# Patient Record
Sex: Female | Born: 1960
Health system: Southern US, Community
[De-identification: ages and names within clinical notes are randomized; demographics above are authoritative.]

## PROBLEM LIST (undated history)

## (undated) DIAGNOSIS — Z720 Tobacco use: Secondary | ICD-10-CM

## (undated) DIAGNOSIS — R519 Headache, unspecified: Secondary | ICD-10-CM

## (undated) DIAGNOSIS — K589 Irritable bowel syndrome without diarrhea: Secondary | ICD-10-CM

## (undated) DIAGNOSIS — J329 Chronic sinusitis, unspecified: Secondary | ICD-10-CM

## (undated) DIAGNOSIS — I252 Old myocardial infarction: Secondary | ICD-10-CM

## (undated) DIAGNOSIS — G8929 Other chronic pain: Secondary | ICD-10-CM

## (undated) DIAGNOSIS — M1990 Unspecified osteoarthritis, unspecified site: Secondary | ICD-10-CM

## (undated) DIAGNOSIS — K219 Gastro-esophageal reflux disease without esophagitis: Secondary | ICD-10-CM

## (undated) DIAGNOSIS — E785 Hyperlipidemia, unspecified: Secondary | ICD-10-CM

## (undated) DIAGNOSIS — F329 Major depressive disorder, single episode, unspecified: Secondary | ICD-10-CM

## (undated) DIAGNOSIS — J449 Chronic obstructive pulmonary disease, unspecified: Secondary | ICD-10-CM

## (undated) DIAGNOSIS — R51 Headache: Secondary | ICD-10-CM

## (undated) DIAGNOSIS — I10 Essential (primary) hypertension: Secondary | ICD-10-CM

## (undated) DIAGNOSIS — I501 Left ventricular failure: Secondary | ICD-10-CM

## (undated) DIAGNOSIS — E039 Hypothyroidism, unspecified: Secondary | ICD-10-CM

## (undated) DIAGNOSIS — K52832 Lymphocytic colitis: Secondary | ICD-10-CM

## (undated) DIAGNOSIS — M797 Fibromyalgia: Secondary | ICD-10-CM

## (undated) DIAGNOSIS — F32A Depression, unspecified: Secondary | ICD-10-CM

## (undated) DIAGNOSIS — I2510 Atherosclerotic heart disease of native coronary artery without angina pectoris: Secondary | ICD-10-CM

## (undated) HISTORY — DX: Hyperlipidemia, unspecified: E78.5

## (undated) HISTORY — DX: Hypothyroidism, unspecified: E03.9

## (undated) HISTORY — PX: TOTAL ABDOMINAL HYSTERECTOMY W/ BILATERAL SALPINGOOPHORECTOMY: SHX83

## (undated) HISTORY — DX: Atherosclerotic heart disease of native coronary artery without angina pectoris: I25.10

## (undated) HISTORY — DX: Essential (primary) hypertension: I10

## (undated) HISTORY — PX: CHOLECYSTECTOMY: SHX55

## (undated) HISTORY — DX: Chronic obstructive pulmonary disease, unspecified: J44.9

## (undated) HISTORY — DX: Chronic sinusitis, unspecified: J32.9

## (undated) HISTORY — DX: Headache, unspecified: R51.9

## (undated) HISTORY — DX: Tobacco use: Z72.0

## (undated) HISTORY — DX: Left ventricular failure, unspecified: I50.1

## (undated) HISTORY — DX: Fibromyalgia: M79.7

## (undated) HISTORY — DX: Lymphocytic colitis: K52.832

## (undated) HISTORY — PX: EYE SURGERY: SHX253

## (undated) HISTORY — DX: Headache: R51

## (undated) HISTORY — DX: Old myocardial infarction: I25.2

## (undated) HISTORY — PX: BACK SURGERY: SHX140

## (undated) HISTORY — DX: Irritable bowel syndrome, unspecified: K58.9

## (undated) HISTORY — DX: Other chronic pain: G89.29

## (undated) HISTORY — PX: FOOT SURGERY: SHX648

---

## 2004-11-13 ENCOUNTER — Ambulatory Visit: Payer: Self-pay | Admitting: Family Medicine

## 2004-11-19 ENCOUNTER — Ambulatory Visit: Payer: Self-pay | Admitting: Cardiology

## 2004-11-30 ENCOUNTER — Ambulatory Visit: Payer: Self-pay

## 2004-12-10 ENCOUNTER — Ambulatory Visit: Payer: Self-pay | Admitting: Cardiology

## 2004-12-28 ENCOUNTER — Ambulatory Visit: Payer: Self-pay | Admitting: Family Medicine

## 2005-07-02 ENCOUNTER — Ambulatory Visit: Payer: Self-pay | Admitting: Family Medicine

## 2005-07-16 ENCOUNTER — Ambulatory Visit: Payer: Self-pay | Admitting: Family Medicine

## 2006-07-02 ENCOUNTER — Ambulatory Visit: Payer: Self-pay | Admitting: Family Medicine

## 2008-04-22 DIAGNOSIS — K52832 Lymphocytic colitis: Secondary | ICD-10-CM

## 2008-04-22 HISTORY — DX: Lymphocytic colitis: K52.832

## 2009-11-06 ENCOUNTER — Encounter: Payer: Self-pay | Admitting: Internal Medicine

## 2009-11-09 ENCOUNTER — Ambulatory Visit: Payer: Self-pay | Admitting: Internal Medicine

## 2009-11-09 DIAGNOSIS — R0602 Shortness of breath: Secondary | ICD-10-CM

## 2009-11-09 DIAGNOSIS — E785 Hyperlipidemia, unspecified: Secondary | ICD-10-CM

## 2009-11-09 DIAGNOSIS — I10 Essential (primary) hypertension: Secondary | ICD-10-CM | POA: Insufficient documentation

## 2009-11-09 DIAGNOSIS — R51 Headache: Secondary | ICD-10-CM

## 2009-11-09 DIAGNOSIS — J310 Chronic rhinitis: Secondary | ICD-10-CM

## 2009-11-09 DIAGNOSIS — R519 Headache, unspecified: Secondary | ICD-10-CM | POA: Insufficient documentation

## 2009-11-09 DIAGNOSIS — E039 Hypothyroidism, unspecified: Secondary | ICD-10-CM | POA: Insufficient documentation

## 2009-11-09 DIAGNOSIS — Z720 Tobacco use: Secondary | ICD-10-CM

## 2009-11-09 DIAGNOSIS — IMO0001 Reserved for inherently not codable concepts without codable children: Secondary | ICD-10-CM

## 2009-11-15 ENCOUNTER — Ambulatory Visit: Payer: Self-pay | Admitting: Cardiology

## 2009-11-16 ENCOUNTER — Telehealth (INDEPENDENT_AMBULATORY_CARE_PROVIDER_SITE_OTHER): Payer: Self-pay | Admitting: *Deleted

## 2010-01-23 ENCOUNTER — Emergency Department (HOSPITAL_COMMUNITY): Admission: EM | Admit: 2010-01-23 | Discharge: 2010-01-23 | Payer: Self-pay | Admitting: Emergency Medicine

## 2010-05-22 NOTE — Progress Notes (Signed)
Summary: results of sinus ct given>start augmentin   Phone Note Call from Patient Call back at Work Phone [phone]   Caller: Patient Call For: wert Summary of Call: returning call to leslie Initial call taken by: Lacinda Axon,  November 16, 2009 8:17 AM  Follow-up for Phone Call        Spoke with pt and notified of results/recs per MW. Pt verbalized understanding.  Rx was sent to pharm.  Pt sched for followup on 11/30/09 at 11:30 am. Follow-up by: Vernie Murders,  November 16, 2009 9:09 AM    New/Updated Medications: AUGMENTIN 875-125 MG TABS (AMOXICILLIN-POT CLAVULANATE) 1 by mouth two times a day until gone Prescriptions: AUGMENTIN 875-125 MG TABS (AMOXICILLIN-POT CLAVULANATE) 1 by mouth two times a day until gone  #20 x 0   Entered by:   Vernie Murders   Authorized by:   Nyoka Cowden MD   Signed by:   Vernie Murders on 11/16/2009   Method used:   Electronically to        [facility] [address] [facility] (retail)       [address]       [place], [place]  [address]       Ph: [phone]       Fax: [phone]   RxID:   [id number]

## 2010-05-22 NOTE — Assessment & Plan Note (Signed)
Summary: Pulmonary consultation/ cough and sob/ smoking ? sinus dz   Visit Type:  Initial Consult Copy to:  Dr. Joette Catching Primary Provider/Referring Provider:  Dr. Leticia Penna  CC:  Cough.  History of Present Illness: 50 yowf active smoker trying to quit with episodes of bronchitis maybe once a year typically in winter better after 2  weeks with abx no  need for inhalers/prednisone  November 09, 2009  1st pulmonary office eval cc cough x 1 month > grey green thick worse in am's and with activity assoc increase cough and sob.  presently finishing levaquin and prednisone and on both advair and dulera and no better. Assoc with mild nasal congestion, no sinus pain    Pt denies any significant sore throat, dysphagia, itching, sneezing,   fever, chills, sweats, unintended wt loss, pleuritic or exertional cp, hempoptysis, change in activity tolerance  orthopnea pnd or leg swelling. Pt also denies any obvious fluctuation in symptoms with weather or environmental change or other alleviating or aggravating factors.       Current Medications (verified): 1)  Entocort Ec 3 Mg Xr24h-Cap (Budesonide) .Marland Kitchen.. 1 Once Daily 2)  Cymbalta 60 Mg Cpep (Duloxetine Hcl) .Marland Kitchen.. 1 Once Daily 3)  Advair Diskus 100-50 Mcg/dose Aepb (Fluticasone-Salmeterol) .Marland Kitchen.. 1 Puff Two Times A Day 4)  Flonase 50 Mcg/act Susp (Fluticasone Propionate) .Marland Kitchen.. 1 Spray Each Nostril Two Times A Day 5)  Flexeril 10 Mg Tabs (Cyclobenzaprine Hcl) .Marland Kitchen.. 1 Three Times A Day As Needed 6)  Zocor 20 Mg Tabs (Simvastatin) .Marland Kitchen.. 1 Once Daily 7)  Prilosec Otc 20 Mg Tbec (Omeprazole Magnesium) .Marland Kitchen.. 1 Once Daily As Needed 8)  Vicodin 5-500 Mg Tabs (Hydrocodone-Acetaminophen) .Marland Kitchen.. 1 Every 6 Hours As Needed 9)  Acyclovir 400 Mg Tabs (Acyclovir) .Marland Kitchen.. 1 Once Daily 10)  Metoprolol Tartrate 25 Mg Tabs (Metoprolol Tartrate) .Marland Kitchen.. 1 Two Times A Day 11)  Wellbutrin Xl 150 Mg Xr24h-Tab (Bupropion Hcl) .Marland Kitchen.. 1 Once Daily  Allergies (verified): 1)  !  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  Smokes 1 ppd. No ETOH  Review of Systems       The patient complains of shortness of breath with activity, productive cough, chest pain, loss of appetite, abdominal pain, nasal congestion/difficulty breathing through nose, ear ache, anxiety, depression, and change in color of mucus.  The patient denies shortness of breath at rest, non-productive cough, coughing up blood, irregular heartbeats, acid heartburn, indigestion, weight change, difficulty swallowing, sore throat, tooth/dental problems, headaches, sneezing, itching, hand/feet swelling, joint stiffness or pain, rash, and fever.    Vital Signs:  Patient profile:   50 year old female Height:      62.5 inches Weight:      153.38 pounds BMI:     27.71 O2 Sat:      98 % on Room air Temp:     98.5 degrees F oral Pulse rate:   84 / minute BP sitting:   130 / 78  (left arm)  Vitals Entered By:  Vernie Murders (November 09, 2009 11:34 AM)  O2 Flow:  Room air  Physical Exam  Additional Exam:  amb wf nad HEENT mild turbinate edema.  Oropharynx no thrush or excess pnd or cobblestoning.  No JVD or cervical adenopathy. Mild accessory muscle hypertrophy. Trachea midline, nl thryroid. Chest was hyperinflated by percussion with diminished breath sounds and moderate  increased exp time without wheeze. Hoover sign positive at mid inspiration. Regular rate and rhythm without murmur gallop or rub or increase P2 or edema.  Abd: no hsm, nl excursion. Ext warm without cyanosis or clubbing.     Impression & Recommendations:  Problem # 1:  DYSPNEA (ICD-786.05)    DDX of  difficult airways managment all start with A and  include Adherence, Ace Inhibitors, Acid Reflux, Active Sinus Disease, Alpha 1 Antitripsin deficiency, Anxiety masquerading as Airways dz,  ABPA,  allergy(esp in young), Aspiration (esp in elderly), Adverse effects of DPI,  Active smokers, plus one B  = Beta blocker use.Marland Kitchen   Active smoking greatest concern see problem # 3  ? acid reflux :  max rx plus diet reviewed  ? adverse effect of DPI - try just on hfa  ? adherence: not transparent re meds. I spent extra time with the patient today explaining optimal mdi  technique.  This improved from  25-50%  Orders: Consultation Level V (44010)  Problem # 2:  CHRONIC RHINITIS (ICD-472.0) Needs sinus ct since still purulent sputum on levaquin  Problem # 3:  SMOKER (ICD-305.1)   I emphasized that although we never turn away smokers from the pulmonary clinic, we do ask that they understand that the recommendations that were made won't work nearly as well in the presence of continued cigarette exposure and we may reach a point where we can't help the patient if he/she can't quit smoking.   Try increase wellbutrin to 300 mg daily if tolerates  Each maintenance medication was reviewed in detail including most importantly the difference between maintenance prns and under what circumstances the prns are to be used.  In addition, these two groups (for which the patient should keep up with refills) were distinguished from a third group :  meds that are used only short term with the intent to complete a course of therapy and then not refill them.  The med list was then fully reconciled and reorganized to reflect  this important distinction.   Orders: Consultation Level V [phone])  Medications Added to Medication List This Visit: 1)  Entocort Ec 3 Mg Xr24h-cap (Budesonide) .Marland Kitchen.. 1 once daily 2)  Cymbalta 60 Mg Cpep (Duloxetine hcl) .Marland Kitchen.. 1 once daily 3)  Advair Diskus 100-50 Mcg/dose Aepb (Fluticasone-salmeterol) .Marland Kitchen.. 1 puff two times a day 4)  Flonase 50 Mcg/act Susp (Fluticasone propionate) .Marland Kitchen.. 1 spray each nostril two times a day 5)  Zocor 20 Mg Tabs (Simvastatin) .Marland Kitchen.. 1 once daily 6)  Prilosec Otc 20 Mg Tbec (Omeprazole magnesium) .... Take one 30-60 min before first and last meals of the day 7)  Prilosec Otc 20 Mg Tbec (Omeprazole magnesium) .Marland Kitchen.. 1 once daily as needed 8)  Wellbutrin Xl 300 Mg Xr24h-tab (Bupropion hcl) .... One daily 9)  Dulera 100-5 Mcg/act Aero (Mometasone furo-formoterol fum) .... 2 puffs first thing  in am and 2 puffs again in pm about 12 hours later 10)  Acyclovir 400 Mg Tabs (Acyclovir) .Marland Kitchen.. 1 once daily 11)  Metoprolol Tartrate 25 Mg Tabs (Metoprolol tartrate) .Marland Kitchen.. 1 two times a day 12)  Wellbutrin Xl  150 Mg Xr24h-tab (Bupropion hcl) .Marland Kitchen.. 1 once daily 13)  Vicodin 5-500 Mg Tabs (Hydrocodone-acetaminophen) .Marland Kitchen.. 1 every 6 hours as needed 14)  Flexeril 10 Mg Tabs (Cyclobenzaprine hcl) .Marland Kitchen.. 1 three times a day as needed 15)  Wellbutrin Xl 150 Mg Xr24h-tab (Bupropion hcl) .... 2 daily  Complete Medication List: 1)  Entocort Ec 3 Mg Xr24h-cap (Budesonide) .Marland Kitchen.. 1 once daily 2)  Cymbalta 60 Mg Cpep (Duloxetine hcl) .Marland Kitchen.. 1 once daily 3)  Flonase 50 Mcg/act Susp (Fluticasone propionate) .Marland Kitchen.. 1 spray each nostril two times a day 4)  Zocor 20 Mg Tabs (Simvastatin) .Marland Kitchen.. 1 once daily 5)  Prilosec Otc 20 Mg Tbec (Omeprazole magnesium) .... Take one 30-60 min before first and last meals of the day 6)  Wellbutrin Xl 300 Mg Xr24h-tab (Bupropion hcl) .... One daily 7)  Dulera 100-5 Mcg/act Aero (Mometasone furo-formoterol fum) .... 2 puffs first thing  in am and 2 puffs again in pm about  12 hours later 8)  Acyclovir 400 Mg Tabs (Acyclovir) .Marland Kitchen.. 1 once daily 9)  Metoprolol Tartrate 25 Mg Tabs (Metoprolol tartrate) .Marland Kitchen.. 1 two times a day 10)  Vicodin 5-500 Mg Tabs (Hydrocodone-acetaminophen) .Marland Kitchen.. 1 every 6 hours as needed 11)  Flexeril 10 Mg Tabs (Cyclobenzaprine hcl) .Marland Kitchen.. 1 three times a day as needed  Other Orders: Misc. Referral (Misc. Ref)  Patient Instructions: 1)  stop smoking if at all possible 2)  GERD (REFLUX)  is a common cause of respiratory symptoms. It commonly presents without heartburn and can be treated with medication, but also with lifestyle changes including avoidance of late meals, excessive alcohol, smoking cessation, and avoid fatty foods, chocolate, peppermint, colas, red wine, and acidic juices such as orange juice. NO MINT OR MENTHOL PRODUCTS SO NO COUGH DROPS  3)  USE SUGARLESS CANDY INSTEAD (jolley ranchers)  4)  NO OIL BASED VITAMINS  5)  Dulera 100 2 puffs first thing  in am and 2 puffs again in pm about 12 hours later  6)  Take  mucinex dm 2 every 12 hours and add  vicodin  up to 2 every 4 hours to suppress the urge to cough. Swallowing water or using ice chips/non mint and menthol containing candies (such as lifesavers or sugarless jolly ranchers) are also effective.  7)  stop advair  8)  Please schedule a follow-up appointment in 2 weeks, sooner if needed bring all medications separated the maint vs prns 9)  See Patient Care Coordinator before leaving for Sinus CT  Prescriptions: WELLBUTRIN XL 300 MG XR24H-TAB (BUPROPION HCL) one daily  #110 x 0   Entered and Authorized by:   Nyoka Cowden MD   Signed by:   Nyoka Cowden MD on 11/09/2009   Method used:   Print then Give to Patient   RxID:   [id number] VICODIN 5-500 MG TABS (HYDROCODONE-ACETAMINOPHEN) 1 every 6 hours as needed  #40 x 0   Entered and Authorized by:   Nyoka Cowden MD   Signed by:   Nyoka Cowden MD on 11/09/2009   Method used:   Print then Give to Patient   RxID:    [id number] PRILOSEC OTC 20 MG TBEC (OMEPRAZOLE MAGNESIUM) Take one 30-60 min before first and last meals of the day  #60 x 11   Entered and Authorized by:   Nyoka Cowden MD   Signed by:   Nyoka Cowden MD on 11/09/2009   Method used:   Electronically to  [facility] [address] [facility] (retail)       [address]       [place], [place]  [address]       Ph: [phone]       Fax: [phone]   RxID:   [id number]

## 2010-10-15 ENCOUNTER — Encounter: Payer: Self-pay | Admitting: Gastroenterology

## 2010-11-27 ENCOUNTER — Ambulatory Visit: Payer: Self-pay | Admitting: Gastroenterology

## 2011-03-02 ENCOUNTER — Other Ambulatory Visit: Payer: Self-pay

## 2011-03-02 ENCOUNTER — Emergency Department (HOSPITAL_COMMUNITY)
Admission: EM | Admit: 2011-03-02 | Discharge: 2011-03-02 | Disposition: A | Discharge status: Home / Self Care | Payer: PRIVATE HEALTH INSURANCE | Attending: Emergency Medicine | Admitting: Emergency Medicine

## 2011-03-02 ENCOUNTER — Other Ambulatory Visit: Payer: Self-pay | Admitting: Internal Medicine

## 2011-03-02 ENCOUNTER — Emergency Department (HOSPITAL_COMMUNITY): Payer: PRIVATE HEALTH INSURANCE

## 2011-03-02 ENCOUNTER — Encounter: Payer: Self-pay | Admitting: *Deleted

## 2011-03-02 DIAGNOSIS — R0602 Shortness of breath: Secondary | ICD-10-CM | POA: Insufficient documentation

## 2011-03-02 DIAGNOSIS — M129 Arthropathy, unspecified: Secondary | ICD-10-CM | POA: Insufficient documentation

## 2011-03-02 DIAGNOSIS — R110 Nausea: Secondary | ICD-10-CM | POA: Insufficient documentation

## 2011-03-02 DIAGNOSIS — F3289 Other specified depressive episodes: Secondary | ICD-10-CM | POA: Insufficient documentation

## 2011-03-02 DIAGNOSIS — R079 Chest pain, unspecified: Secondary | ICD-10-CM | POA: Insufficient documentation

## 2011-03-02 DIAGNOSIS — R109 Unspecified abdominal pain: Secondary | ICD-10-CM | POA: Insufficient documentation

## 2011-03-02 DIAGNOSIS — F329 Major depressive disorder, single episode, unspecified: Secondary | ICD-10-CM | POA: Insufficient documentation

## 2011-03-02 DIAGNOSIS — F17200 Nicotine dependence, unspecified, uncomplicated: Secondary | ICD-10-CM | POA: Insufficient documentation

## 2011-03-02 DIAGNOSIS — K219 Gastro-esophageal reflux disease without esophagitis: Secondary | ICD-10-CM | POA: Insufficient documentation

## 2011-03-02 DIAGNOSIS — Z79899 Other long term (current) drug therapy: Secondary | ICD-10-CM | POA: Insufficient documentation

## 2011-03-02 DIAGNOSIS — E079 Disorder of thyroid, unspecified: Secondary | ICD-10-CM | POA: Insufficient documentation

## 2011-03-02 DIAGNOSIS — R6883 Chills (without fever): Secondary | ICD-10-CM | POA: Insufficient documentation

## 2011-03-02 HISTORY — DX: Depression, unspecified: F32.A

## 2011-03-02 HISTORY — DX: Major depressive disorder, single episode, unspecified: F32.9

## 2011-03-02 HISTORY — DX: Gastro-esophageal reflux disease without esophagitis: K21.9

## 2011-03-02 HISTORY — DX: Unspecified osteoarthritis, unspecified site: M19.90

## 2011-03-02 LAB — CBC
HCT: 41.5 % (ref 36.0–46.0)
MCHC: 34 g/dL (ref 30.0–36.0)
Platelets: 279 10*3/uL (ref 150–400)
RDW: 12.2 % (ref 11.5–15.5)

## 2011-03-02 LAB — COMPREHENSIVE METABOLIC PANEL
AST: 52 U/L — ABNORMAL HIGH (ref 0–37)
Albumin: 4.3 g/dL (ref 3.5–5.2)
Chloride: 102 mEq/L (ref 96–112)
Creatinine, Ser: 0.73 mg/dL (ref 0.50–1.10)
Sodium: 135 mEq/L (ref 135–145)
Total Bilirubin: 0.6 mg/dL (ref 0.3–1.2)

## 2011-03-02 LAB — DIFFERENTIAL
Basophils Absolute: 0.1 10*3/uL (ref 0.0–0.1)
Basophils Relative: 1 % (ref 0–1)
Monocytes Absolute: 0.8 10*3/uL (ref 0.1–1.0)
Neutro Abs: 7.3 10*3/uL (ref 1.7–7.7)
Neutrophils Relative %: 65 % (ref 43–77)

## 2011-03-02 MED ORDER — LORAZEPAM 1 MG PO TABS: 1.0000 mg | ORAL_TABLET | Freq: Three times a day (TID) | ORAL | Status: AC | PRN

## 2011-03-02 MED ORDER — ONDANSETRON HCL 4 MG/2ML IJ SOLN
4.0000 mg | Freq: Once | INTRAMUSCULAR | Status: AC
  Administered 2011-03-02: 4 mg via INTRAVENOUS
  Filled 2011-03-02: qty 2

## 2011-03-02 MED ORDER — SODIUM CHLORIDE 0.9 % IV SOLN
999.0000 mL | Freq: Once | INTRAVENOUS | Status: AC
  Administered 2011-03-02: 999 mL via INTRAVENOUS

## 2011-03-02 MED ORDER — HYDROMORPHONE HCL PF 1 MG/ML IJ SOLN
0.5000 mg | Freq: Once | INTRAMUSCULAR | Status: AC
  Administered 2011-03-02: 0.5 mg via INTRAVENOUS
  Filled 2011-03-02: qty 1

## 2011-03-02 MED ORDER — IOHEXOL 300 MG/ML  SOLN: 100.0000 mL | Freq: Once | INTRAMUSCULAR | Status: DC | PRN

## 2011-03-02 MED ORDER — HYDROCODONE-ACETAMINOPHEN 5-325 MG PO TABS: 2.0000 | ORAL_TABLET | ORAL | Status: AC | PRN

## 2011-03-02 NOTE — ED Provider Notes (Signed)
Scribed for Angela Munch, MD, the patient was seen in room APA17/APA17. This chart was scribed by [employer]. The patient's care started at 13:31  CSN: [id number] Arrival date & time: 03/02/2011  1:00 PM   First MD Initiated Contact with Patient 03/02/11 1331      Chief Complaint  Patient presents with  . Abdominal Pain   HPI Angela Roth is a 50 y.o. female who presents to the Emergency Department complaining of Abdominal pain with associated chest pain since 11am today. Patient localizes pain to the upper abdomen. Pain is intermittent. Pain is worse with exertion and taking deep breaths. Patient reports nausea, chills but denies, vomiting, headache, disorientation, or swelling in the legs. Patient places pain at 4/10 on NPS.  Past Medical History  Diagnosis Date  . GERD (gastroesophageal reflux disease)   . Colitis   . Thyroid disease   . Arthritis   . Depression     Past Surgical History  Procedure Date  . Back surgery   . Cholecystectomy   . Abdominal hysterectomy   . Total abdominal hysterectomy w/ bilateral salpingoophorectomy     No family history on file.  History  Substance Use Topics  . Smoking status: Current Everyday Smoker  . Smokeless tobacco: Not on file  . Alcohol Use: No    OB History    Grav Para Term Preterm Abortions TAB SAB Ect Mult Living                  Review of Systems  Constitutional: Negative for fever.       10 Systems reviewed and are negative for acute change except as noted in the HPI.  HENT: Negative for rhinorrhea.   Eyes: Negative for discharge and redness.  Respiratory: Positive for shortness of breath. Negative for cough.   Cardiovascular: Negative for chest pain.  Gastrointestinal: Positive for nausea. Negative for vomiting, abdominal pain and diarrhea.  Genitourinary: Negative for dysuria.  Musculoskeletal: Negative for back pain.  Skin: Negative for rash.  Neurological: Negative for dizziness, syncope, speech  difficulty, weakness, numbness and headaches.  Psychiatric/Behavioral: Negative for suicidal ideas, hallucinations and confusion.  All other systems reviewed and are negative.    Allergies  Indomethacin  Home Medications  No current outpatient prescriptions on file.  BP 141/84  Pulse 83  Temp(Src) 98.3 F (36.8 C) (Oral)  Resp 18  Ht 5\' 2"  (1.575 m)  Wt 153 lb (69.4 kg)  BMI 27.98 kg/m2  SpO2 99%  Physical Exam  Nursing note and vitals reviewed. Constitutional: She is oriented to person, place, and time. She appears well-developed and well-nourished. No distress.  HENT:  Head: Normocephalic and atraumatic.  Mouth/Throat: Oropharynx is clear and moist. No oropharyngeal exudate.  Eyes: Conjunctivae and EOM are normal. Right eye exhibits no discharge. Left eye exhibits no discharge.  Neck: Normal range of motion. No tracheal deviation present.  Cardiovascular: Normal rate, regular rhythm and normal heart sounds.   No murmur heard. Pulmonary/Chest: Effort normal and breath sounds normal. No respiratory distress. She has no wheezes. She has no rales.  Abdominal: Soft. Bowel sounds are normal. She exhibits no distension. There is tenderness (diffuse).  Musculoskeletal: Normal range of motion. She exhibits no edema and no tenderness.  Neurological: She is alert and oriented to person, place, and time.  Skin: Skin is warm and dry. No rash noted. She is not diaphoretic. No erythema.  Psychiatric: She has a normal mood and affect. Her behavior is normal. Judgment  normal.    ED Course  Procedures   DIAGNOSTIC STUDIES: Oxygen Saturation is 99% on room air, normal by my interpretation.    COORDINATION OF CARE: 14:15 - EDP examined patient at bedside and ordered the following Orders Placed This Encounter  Procedures  . ED EKG     Date: 03/02/2011  Rate: 82  Rhythm: normal sinus rhythm  QRS Axis: normal  Intervals: normal  ST/T Wave abnormalities: normal  Conduction  Disutrbances:none  Narrative Interpretation:   Old EKG Reviewed: none available  Results for orders placed during the hospital encounter of 03/02/11  CBC      Component Value Range   WBC 11.2 (*) 4.0 - 10.5 (K/uL)   RBC 4.49  3.87 - 5.11 (MIL/uL)   Hemoglobin 14.1  12.0 - 15.0 (g/dL)   HCT 29.5  62.1 - 30.8 (%)   MCV 92.4  78.0 - 100.0 (fL)   MCH 31.4  26.0 - 34.0 (pg)   MCHC 34.0  30.0 - 36.0 (g/dL)   RDW 65.7  84.6 - 96.2 (%)   Platelets 279  150 - 400 (K/uL)  DIFFERENTIAL      Component Value Range   Neutrophils Relative 65  43 - 77 (%)   Neutro Abs 7.3  1.7 - 7.7 (K/uL)   Lymphocytes Relative 23  12 - 46 (%)   Lymphs Abs 2.5  0.7 - 4.0 (K/uL)   Monocytes Relative 7  3 - 12 (%)   Monocytes Absolute 0.8  0.1 - 1.0 (K/uL)   Eosinophils Relative 5  0 - 5 (%)   Eosinophils Absolute 0.5  0.0 - 0.7 (K/uL)   Basophils Relative 1  0 - 1 (%)   Basophils Absolute 0.1  0.0 - 0.1 (K/uL)  COMPREHENSIVE METABOLIC PANEL      Component Value Range   Sodium 135  135 - 145 (mEq/L)   Potassium 3.5  3.5 - 5.1 (mEq/L)   Chloride 102  96 - 112 (mEq/L)   CO2 23  19 - 32 (mEq/L)   Glucose, Bld 91  70 - 99 (mg/dL)   BUN 9  6 - 23 (mg/dL)   Creatinine, Ser 9.52  0.50 - 1.10 (mg/dL)   Calcium 9.8  8.4 - 84.1 (mg/dL)   Total Protein 7.5  6.0 - 8.3 (g/dL)   Albumin 4.3  3.5 - 5.2 (g/dL)   AST 52 (*) 0 - 37 (U/L)   ALT 36 (*) 0 - 35 (U/L)   Alkaline Phosphatase 88  39 - 117 (U/L)   Total Bilirubin 0.6  0.3 - 1.2 (mg/dL)   GFR calc non Af Amer >90  >90 (mL/min)   GFR calc Af Amer >90  >90 (mL/min)  LIPASE, BLOOD      Component Value Range   Lipase 36  11 - 59 (U/L)   Ct Abdomen Pelvis W Contrast  03/02/2011  *RADIOLOGY REPORT*  Clinical Data: Midepigastric pain radiating to chest, history of colitis  CT ABDOMEN AND PELVIS WITH CONTRAST  Technique:  Multidetector CT imaging of the abdomen and pelvis was performed following the standard protocol during bolus administration of intravenous  contrast.  Contrast:  100 ml Omnipaque-300  Comparison: None.  Findings:  Normal hepatic contour.  Geographic area of hypoattenuation adjacent to the fissure for the ligamentum teres favored to represent focal fatty infiltration.  Post cholecystectomy.  There is mild intrahepatic biliary duct dilatation, possibly sequelae of prior cholecystectomy.  No extrahepatic biliary ductal dilatation. No ascites.  There  is symmetric enhancement and excretion of the bilateral kidneys.  No discrete renal lesion.  No urinary obstruction. Normal appearance of the bilateral adrenal glands.  Normal appearance of the pancreas and spleen.  Two surgical clips are seen about the central aspect of the splenic artery.  Scattered colonic diverticulosis without evidence of diverticulitis.  The bowel is otherwise normal in course and caliber without wall thickening or evidence of obstruction.  Normal appendix.  No pneumoperitoneum, pneumatosis or portal venous gas scattered atherosclerotic calcifications of a normal caliber abdominal aorta.  No retroperitoneal, mesenteric, pelvic or inguinal lymphadenopathy.  Pelvic organs are normal.  No free fluid within the pelvis.  Limited visualization of the lower thorax demonstrates minimal bibasilar subpleural atelectasis.  No focal airspace opacity or pleural effusion.  Normal heart size.  No pericardial effusion.  Bilateral facet degenerative change of the lower lumbar spine.  No acute aggressive osseous abnormalities.  IMPRESSION: 1.  No explanation for patient's abdominal pain.  Specifically, no evidence of enteric or urinary obstruction. 2.  Post cholecystectomy.  Original Report Authenticated By: Waynard Reeds, M.D.        MDM: This generally well appearing female presents with concerns of epigastric, and abdominal pain. Notably the patient has a history of colitis. The patient has minimal, though palpable pain throughout her abdomen in a non-reproducible pattern. Given the patient's  history of colitis, and her description of nausea, she had a CT scan, which was negative for acute pathology. With the absence of notable findings beyond a mild leukocytosis, the patient is appropriate for discharge with continued followup by her primary care physician. Symptoms may be due to colitis, not evident on CT, or other early intra-abdominal processes, or viral illness. This was discussed with the patient and her husband, and they are comfortable with the plan to discharge, with close followup. They note a gallop of return precautions. Just prior to discharge the patient requests a anxiolytic medication. She was previously going to receive Zofran, but will receive Ativan, which is both anxiolytic and antiemetic properties.   Scribe Attestation: I have seen and evaluated the patient the documentation provided his mind, assisted by a scribe.   Angela Munch, MD 03/02/11 [phone]

## 2011-03-02 NOTE — ED Notes (Signed)
Pt unable to give urine sample at time of order. Pt did not void during time in ED.

## 2011-03-02 NOTE — ED Notes (Signed)
Pt c/o increasing mid epigastric pain. No vomiting, diaphoresis or SOB at present.

## 2011-03-02 NOTE — ED Notes (Signed)
RUQ pain.  Pain began 1 hour PTA. States nausea. Denies vomiting.

## 2014-01-30 ENCOUNTER — Emergency Department (HOSPITAL_COMMUNITY)
Admission: EM | Admit: 2014-01-30 | Discharge: 2014-01-30 | Disposition: A | Discharge status: Home / Self Care | Payer: Medicaid Other | Attending: Emergency Medicine | Admitting: Emergency Medicine

## 2014-01-30 ENCOUNTER — Encounter (HOSPITAL_COMMUNITY): Payer: Self-pay | Admitting: Emergency Medicine

## 2014-01-30 ENCOUNTER — Emergency Department (HOSPITAL_COMMUNITY): Payer: Medicaid Other

## 2014-01-30 DIAGNOSIS — Z79899 Other long term (current) drug therapy: Secondary | ICD-10-CM | POA: Insufficient documentation

## 2014-01-30 DIAGNOSIS — R079 Chest pain, unspecified: Secondary | ICD-10-CM

## 2014-01-30 DIAGNOSIS — Z8719 Personal history of other diseases of the digestive system: Secondary | ICD-10-CM | POA: Diagnosis not present

## 2014-01-30 DIAGNOSIS — E079 Disorder of thyroid, unspecified: Secondary | ICD-10-CM | POA: Diagnosis not present

## 2014-01-30 DIAGNOSIS — F329 Major depressive disorder, single episode, unspecified: Secondary | ICD-10-CM | POA: Insufficient documentation

## 2014-01-30 DIAGNOSIS — Z8739 Personal history of other diseases of the musculoskeletal system and connective tissue: Secondary | ICD-10-CM | POA: Insufficient documentation

## 2014-01-30 DIAGNOSIS — Z720 Tobacco use: Secondary | ICD-10-CM | POA: Diagnosis not present

## 2014-01-30 LAB — CBC
HCT: 44.3 % (ref 36.0–46.0)
HEMOGLOBIN: 15.7 g/dL — AB (ref 12.0–15.0)
MCH: 32.4 pg (ref 26.0–34.0)
MCHC: 35.4 g/dL (ref 30.0–36.0)
MCV: 91.3 fL (ref 78.0–100.0)
PLATELETS: 338 10*3/uL (ref 150–400)
RBC: 4.85 MIL/uL (ref 3.87–5.11)
RDW: 12.7 % (ref 11.5–15.5)
WBC: 11.1 10*3/uL — ABNORMAL HIGH (ref 4.0–10.5)

## 2014-01-30 LAB — BASIC METABOLIC PANEL
ANION GAP: 19 — AB (ref 5–15)
BUN: 17 mg/dL (ref 6–23)
CALCIUM: 9.7 mg/dL (ref 8.4–10.5)
CO2: 19 mEq/L (ref 19–32)
Chloride: 96 mEq/L (ref 96–112)
Creatinine, Ser: 0.87 mg/dL (ref 0.50–1.10)
GFR, EST AFRICAN AMERICAN: 86 mL/min — AB (ref >90)
GFR, EST NON AFRICAN AMERICAN: 74 mL/min — AB (ref >90)
GLUCOSE: 90 mg/dL (ref 70–99)
POTASSIUM: 4.1 meq/L (ref 3.7–5.3)
SODIUM: 134 meq/L — AB (ref 137–147)

## 2014-01-30 LAB — TROPONIN I

## 2014-01-30 MED ORDER — LORAZEPAM 1 MG PO TABS
1.0000 mg | ORAL_TABLET | Freq: Once | ORAL | Status: AC
  Administered 2014-01-30: 1 mg via ORAL
  Filled 2014-01-30: qty 1

## 2014-01-30 MED ORDER — LORAZEPAM 1 MG PO TABS: 1.0000 mg | ORAL_TABLET | Freq: Four times a day (QID) | ORAL | Status: DC | PRN

## 2014-01-30 NOTE — ED Provider Notes (Signed)
CSN: [id number]     Arrival date & time 01/30/14  1708 History  This chart was scribed for Geoffery Lyons, MD by Tonye Royalty, ED Scribe. This patient was seen in room APA12/APA12 and the patient's care was started at 5:46 PM.    Chief Complaint  Patient presents with  . Chest Pain   Patient is a 53 y.o. female presenting with chest pain. The history is provided by the patient. No language interpreter was used.  Chest Pain Pain location:  L chest and R chest Pain quality: aching   Pain radiates to:  Neck and L arm Pain radiates to the back: no   Pain severity:  Moderate Onset quality:  Sudden Chronicity:  Recurrent Context: stress   Relieved by:  Nothing Worsened by:  Exertion (stress) Ineffective treatments:  None tried Risk factors: hypertension   Risk factors: no coronary artery disease and no diabetes mellitus   Risk factors comment:  Anxiety   HPI Comments: Angela Roth is a 53 y.o. female who presents to the Emergency Department complaining of aching diffuse chest pain, feeling like she had high blood pressure, and "panting" with onset at 1630 today while walking. She states pain radiates to her arms and neck. She reports associated tingling to her face. She states she has been having anxiety, chest pain, palpitations, and hypertension with associated headache for the past few months. She states she recently has been experiencing chest pain and other symptoms every day when she experiences any physical or emotional stress. She denies history of cardiac disease and reports a benign stress test 9 years ago, though she states her father died of heart attack. She states she has not seen a PCP in 2 years since losing her insurance. She denies history of diabetes. She states she has not been on blood pressure medication since she lost her job and her insurance.  Past Medical History  Diagnosis Date  . GERD (gastroesophageal reflux disease)   . Colitis   . Thyroid disease   . Arthritis    . Depression    Past Surgical History  Procedure Laterality Date  . Back surgery    . Cholecystectomy    . Abdominal hysterectomy    . Total abdominal hysterectomy w/ bilateral salpingoophorectomy     History reviewed. No pertinent family history. History  Substance Use Topics  . Smoking status: Current Every Day Smoker -- 1.00 packs/day  . Smokeless tobacco: Not on file  . Alcohol Use: No   OB History   Grav Para Term Preterm Abortions TAB SAB Ect Mult Living                 Review of Systems  Cardiovascular: Positive for chest pain.  All other systems reviewed and are negative. A complete 10 system review of systems was obtained and all systems are negative except as noted in the HPI and PMH.    Allergies  Indomethacin  Home Medications   Prior to Admission medications   Medication Sig Start Date End Date Taking? Authorizing Provider  buPROPion (WELLBUTRIN XL) 150 MG 24 hr tablet Take 300 mg by mouth daily.      Historical Provider, MD  DULoxetine (CYMBALTA) 60 MG capsule Take 60 mg by mouth daily.      Historical Provider, MD  Fiber CAPS Take 1-2 capsules by mouth daily as needed. For constipation     Historical Provider, MD  levothyroxine (SYNTHROID, LEVOTHROID) 100 MCG tablet Take 100 mcg by mouth daily.  Historical Provider, MD  ST JOHNS WORT PO Take 1-2 capsules by mouth daily.      Historical Provider, MD   Pulse 90  Resp 22  Ht 5\' 3"  (1.6 m)  Wt 153 lb (69.4 kg)  BMI 27.11 kg/m2  SpO2 96% Physical Exam  Nursing note and vitals reviewed. Constitutional: She is oriented to person, place, and time. She appears well-developed and well-nourished.  HENT:  Head: Normocephalic and atraumatic.  Eyes: Conjunctivae are normal.  Neck: Normal range of motion. Neck supple.  Cardiovascular: Normal rate, regular rhythm and normal heart sounds.   No murmur heard. Pulmonary/Chest: Effort normal and breath sounds normal. No respiratory distress. She has no wheezes.  She has no rales. She exhibits tenderness (Mild tenderness to palpation to the anterior chest wall).  Abdominal: Soft. Bowel sounds are normal. She exhibits no distension. There is no tenderness. There is no rebound and no guarding.  Musculoskeletal: Normal range of motion.  Neurological: She is alert and oriented to person, place, and time.  Skin: Skin is warm and dry.  Psychiatric: She has a normal mood and affect.    ED Course  Procedures (including critical care time) Labs Review Labs Reviewed  CBC - Abnormal; Notable for the following:    WBC 11.1 (*)    Hemoglobin 15.7 (*)    All other components within normal limits  BASIC METABOLIC PANEL  TROPONIN I    Imaging Review No results found.   EKG Interpretation   Date/Time:  Sunday January 30 2014 17:16:36 EDT Ventricular Rate:  90 PR Interval:  132 QRS Duration: 70 QT Interval:  400 QTC Calculation: 489 R Axis:   87 Text Interpretation:  Sinus rhythm Biatrial enlargement Borderline  prolonged QT interval No significant change since 03/02/11 Confirmed by  DELOS  MD, Markisha Meding ([id number]) on 01/30/2014 5:52:17 PM     DIAGNOSTIC STUDIES: Oxygen Saturation is 96% on room air, normal by my interpretation.    COORDINATION OF CARE: 5:53 PM Discussed treatment plan with patient at beside, including medication for her anxiety and checking lab work. The patient agrees with the plan and has no further questions at this time.    MDM   Final diagnoses:  None    Patient presents with complaints of chest discomfort that she is experiencing when she becomes anxious. This is been going on for several months. She had an episode this morning that occurred while she was walking down the Street where she became tight in her chest and began to hyperventilate. Her symptoms sound more like a panic attack to me and seemed to resolve here with Ativan. Her workup reveals negative troponin and a normal EKG. She tells me she has had a stress test  many years ago which was normal.  I believe she is appropriate for discharge and highly doubt a cardiac etiology, however I do feel as though a stress test would be in her best interest. She was provided the followup information for [facility] [place]. She is to call tomorrow to arrange this appointment and understands to return to the ER for symptoms substantially worsen or change.  I personally performed the services described in this documentation, which was scribed in my presence. The recorded information has been reviewed and is accurate.      Geoffery Lyonsouglas Mirtie Bastyr, MD 01/31/14 [phone]

## 2014-01-30 NOTE — ED Notes (Signed)
Pt reports chest pain onset x45 minutes ago. Moderate dyspnea noted. Pt not able to tolerate speaking at this time. Pt non-diaphoretic. Pt alert.

## 2014-01-30 NOTE — Discharge Instructions (Signed)
Ativan as prescribed as needed for anxiety.  Followup with [facility] to discuss a stress test. The contact information has been provided on this discharge summary.   Chest Pain (Nonspecific) It is often hard to give a specific diagnosis for the cause of chest pain. There is always a chance that your pain could be related to something serious, such as a heart attack or a blood clot in the lungs. You need to follow up with your health care provider for further evaluation. CAUSES   Heartburn.  Pneumonia or bronchitis.  Anxiety or stress.  Inflammation around your heart (pericarditis) or lung (pleuritis or pleurisy).  A blood clot in the lung.  A collapsed lung (pneumothorax). It can develop suddenly on its own (spontaneous pneumothorax) or from trauma to the chest.  Shingles infection (herpes zoster virus). The chest wall is composed of bones, muscles, and cartilage. Any of these can be the source of the pain.  The bones can be bruised by injury.  The muscles or cartilage can be strained by coughing or overwork.  The cartilage can be affected by inflammation and become sore (costochondritis). DIAGNOSIS  Lab tests or other studies may be needed to find the cause of your pain. Your health care provider may have you take a test called an ambulatory electrocardiogram (ECG). An ECG records your heartbeat patterns over a 24-hour period. You may also have other tests, such as:  Transthoracic echocardiogram (TTE). During echocardiography, sound waves are used to evaluate how blood flows through your heart.  Transesophageal echocardiogram (TEE).  Cardiac monitoring. This allows your health care provider to monitor your heart rate and rhythm in real time.  Holter monitor. This is a portable device that records your heartbeat and can help diagnose heart arrhythmias. It allows your health care provider to track your heart activity for several days, if needed.  Stress tests by  exercise or by giving medicine that makes the heart beat faster. TREATMENT   Treatment depends on what may be causing your chest pain. Treatment may include:  Acid blockers for heartburn.  Anti-inflammatory medicine.  Pain medicine for inflammatory conditions.  Antibiotics if an infection is present.  You may be advised to change lifestyle habits. This includes stopping smoking and avoiding alcohol, caffeine, and chocolate.  You may be advised to keep your head raised (elevated) when sleeping. This reduces the chance of acid going backward from your stomach into your esophagus. Most of the time, nonspecific chest pain will improve within 2-3 days with rest and mild pain medicine.  HOME CARE INSTRUCTIONS   If antibiotics were prescribed, take them as directed. Finish them even if you start to feel better.  For the next few days, avoid physical activities that bring on chest pain. Continue physical activities as directed.  Do not use any tobacco products, including cigarettes, chewing tobacco, or electronic cigarettes.  Avoid drinking alcohol.  Only take medicine as directed by your health care provider.  Follow your health care provider's suggestions for further testing if your chest pain does not go away.  Keep any follow-up appointments you made. If you do not go to an appointment, you could develop lasting (chronic) problems with pain. If there is any problem keeping an appointment, call to reschedule. SEEK MEDICAL CARE IF:   Your chest pain does not go away, even after treatment.  You have a rash with blisters on your chest.  You have a fever. SEEK IMMEDIATE MEDICAL CARE IF:   You have increased  chest pain or pain that spreads to your arm, neck, jaw, back, or abdomen.  You have shortness of breath.  You have an increasing cough, or you cough up blood.  You have severe back or abdominal pain.  You feel nauseous or vomit.  You have severe weakness.  You  faint.  You have chills. This is an emergency. Do not wait to see if the pain will go away. Get medical help at once. Call your local emergency services (911 in [place] Do not drive yourself to the hospital. MAKE SURE YOU:   Understand these instructions.  Will watch your condition.  Will get help right away if you are not doing well or get worse. Document Released: 01/16/2005 Document Revised: 04/13/2013 Document Reviewed: 11/12/2007 [facility] Patient Information 2015 [place], [place]. This information is not intended to replace advice given to you by your health care provider. Make sure you discuss any questions you have with your health care provider.

## 2014-06-09 ENCOUNTER — Telehealth: Payer: Self-pay | Admitting: Cardiology

## 2014-06-09 NOTE — Telephone Encounter (Signed)
Received records from [facility]-for appointment with Dr Antoine Poche on 06/24/14.  Records given to [facility] (medical records) for Dr Hochrein's schedule on 06/24/14.  lp

## 2014-06-13 ENCOUNTER — Encounter: Payer: PRIVATE HEALTH INSURANCE | Admitting: Cardiology

## 2014-06-13 ENCOUNTER — Encounter: Payer: Self-pay | Admitting: Cardiology

## 2014-06-13 NOTE — Progress Notes (Signed)
No show  This encounter was created in error - please disregard.

## 2014-06-14 ENCOUNTER — Encounter: Payer: Self-pay | Admitting: Cardiology

## 2014-06-24 ENCOUNTER — Ambulatory Visit: Payer: PRIVATE HEALTH INSURANCE | Admitting: Cardiology

## 2014-06-30 ENCOUNTER — Ambulatory Visit (INDEPENDENT_AMBULATORY_CARE_PROVIDER_SITE_OTHER): Payer: Medicaid Other | Admitting: Cardiology

## 2014-06-30 ENCOUNTER — Encounter: Payer: Self-pay | Admitting: Cardiology

## 2014-06-30 VITALS — BP 111/78 | HR 98 | Ht 62.5 in | Wt 146.0 lb

## 2014-06-30 DIAGNOSIS — R0789 Other chest pain: Secondary | ICD-10-CM

## 2014-06-30 DIAGNOSIS — Z136 Encounter for screening for cardiovascular disorders: Secondary | ICD-10-CM | POA: Diagnosis not present

## 2014-06-30 DIAGNOSIS — R0602 Shortness of breath: Secondary | ICD-10-CM | POA: Diagnosis not present

## 2014-06-30 MED ORDER — NITROGLYCERIN 0.4 MG SL SUBL: 0.4000 mg | SUBLINGUAL_TABLET | SUBLINGUAL | Status: AC | PRN

## 2014-06-30 NOTE — Progress Notes (Signed)
Clinical Summary Angela Roth is a 54 y.o.female seen today for follow up of the following medical problems.   1. Chest pain - started approx 6 months ago. Chest pain in midchest pressure, 3-4/10. Can occur at rest or with exertion, but somewhat more with activity. Can feel heart racing, some nausea, weakness. Worst with deep breaths. Pain can last all day long. - DOE at <1/2 block which is progressing. Denies any LE edema.   CAD risk factors: father MI 33, HTN, +tobacco x 40 years, HL (not on statin due to muscle aches)  2. Hyperlipidemia - followed by pcp, currently working on dietary changes  3. Leg pains - anterior thighs, bilateral calf. Tends to occur more often with sitting.     Past Medical History  Diagnosis Date  . GERD (gastroesophageal reflux disease)   . Colitis   . Hypothyroidism   . Arthritis   . Depression   . Essential hypertension   . Hyperlipidemia   . Fibromyalgia   . Chronic headaches      Allergies  Allergen Reactions  . Indomethacin Other (See Comments)    Patient has bad side effects "suicidal thoughts/homicidal thoughts"     Current Outpatient Prescriptions  Medication Sig Dispense Refill  . buPROPion (WELLBUTRIN XL) 150 MG 24 hr tablet Take 300 mg by mouth daily.      . DULoxetine (CYMBALTA) 60 MG capsule Take 60 mg by mouth daily.      . Fiber CAPS Take 1-2 capsules by mouth daily as needed. For constipation     . levothyroxine (SYNTHROID, LEVOTHROID) 100 MCG tablet Take 100 mcg by mouth daily.      Marland Kitchen LORazepam (ATIVAN) 1 MG tablet Take 1 tablet (1 mg total) by mouth every 6 (six) hours as needed for anxiety. 10 tablet 0  . ST JOHNS WORT PO Take 1-2 capsules by mouth daily.       No current facility-administered medications for this visit.     Past Surgical History  Procedure Laterality Date  . Back surgery    . Cholecystectomy    . Total abdominal hysterectomy w/ bilateral salpingoophorectomy       Allergies  Allergen  Reactions  . Indomethacin Other (See Comments)    Patient has bad side effects "suicidal thoughts/homicidal thoughts"      Family History  Problem Relation Age of Onset  . Lung cancer Maternal Grandmother   . Ovarian cancer Maternal Grandmother   . Asthma Brother      Social History Angela Roth reports that she has been smoking Cigarettes.  She has been smoking about 1.00 pack per day. She does not have any smokeless tobacco history on file. Angela Roth reports that she does not drink alcohol.   Review of Systems CONSTITUTIONAL: No weight loss, fever, chills, weakness or fatigue.  HEENT: Eyes: No visual loss, blurred vision, double vision or yellow sclerae.No hearing loss, sneezing, congestion, runny nose or sore throat.  SKIN: No rash or itching.  CARDIOVASCULAR:per HPI  RESPIRATORY: No shortness of breath, cough or sputum.  GASTROINTESTINAL: No anorexia, nausea, vomiting or diarrhea. No abdominal pain or blood.  GENITOURINARY: No burning on urination, no polyuria NEUROLOGICAL: No headache, dizziness, syncope, paralysis, ataxia, numbness or tingling in the extremities. No change in bowel or bladder control.  MUSCULOSKELETAL: + leg pains LYMPHATICS: No enlarged nodes. No history of splenectomy.  PSYCHIATRIC: No history of depression or anxiety.  ENDOCRINOLOGIC: No reports of sweating, cold or heat intolerance. No  polyuria or polydipsia.  Marland Kitchen   Physical Examination p 98 bp 111/78 Wt 146 lbs BMI 26 Gen: resting comfortably, no acute distress HEENT: no scleral icterus, pupils equal round and reactive, no palptable cervical adenopathy,  CV: RRR, no m/r/g, no JVD, no carotid bruits. 1+ bilateral DP/PT pulses Resp: Clear to auscultation bilaterally GI: abdomen is soft, non-tender, non-distended, normal bowel sounds, no hepatosplenomegaly MSK: extremities are warm, no edema.  Skin: warm, no rash Neuro:  no focal deficits Psych: appropriate affect   Diagnostic Studies EKG  sinus tach rate 101    Assessment and Plan  1. Chest pain - mixed symptoms for cardiac etiology. She does have a strong family hx and other CAD risk factors. Worsening DOE over the last few months as well - will obtain echo, if normal LVEF pursue non-invasive testing with GXT, if now LVEF or WMAs consider invasive testing - start ASA  daily, given Rx for SL NG prn.  2. Hyperlipidemia - per pcp, request most recent labs  3. Leg pain - atypical for claudication. She does however have diminished lower extremity pulses and strong smoking hx.  - once cardiac workup complete, likely obtain ABIs.    F/u pending testing results. Likely GXT pending echo results.    Antoine Poche, M.D.

## 2014-06-30 NOTE — Patient Instructions (Signed)
Your physician recommends that you schedule a follow-up appointment TO BE DETERMINED AFTER TESTING  Your physician has requested that you have an echocardiogram. Echocardiography is a painless test that uses sound waves to create images of your heart. It provides your doctor with information about the size and shape of your heart and how well your heart's chambers and valves are working. This procedure takes approximately one hour. There are no restrictions for this procedure.  Your physician has recommended you make the following change in your medication:   START TAKING ASPIRIN 81 MG DAILY  TAKE NITROGLYCERIN AS NEEDED AS INSTRUCTED.  CONTINUE ALL OTHER MEDICATIONS AS DIRECTED.   Thank you for choosing [facility]!!

## 2014-07-06 ENCOUNTER — Other Ambulatory Visit: Payer: Self-pay

## 2014-07-06 ENCOUNTER — Other Ambulatory Visit (INDEPENDENT_AMBULATORY_CARE_PROVIDER_SITE_OTHER): Payer: Medicaid Other

## 2014-07-06 DIAGNOSIS — Z136 Encounter for screening for cardiovascular disorders: Secondary | ICD-10-CM | POA: Diagnosis not present

## 2014-07-06 DIAGNOSIS — R0789 Other chest pain: Secondary | ICD-10-CM | POA: Diagnosis not present

## 2014-07-06 DIAGNOSIS — R0602 Shortness of breath: Secondary | ICD-10-CM | POA: Diagnosis not present

## 2014-07-12 ENCOUNTER — Telehealth: Payer: Self-pay | Admitting: *Deleted

## 2014-07-12 DIAGNOSIS — R079 Chest pain, unspecified: Secondary | ICD-10-CM

## 2014-07-12 NOTE — Telephone Encounter (Signed)
Orders in for GXT Will forward to schedulers and call pt when appt made

## 2014-07-12 NOTE — Telephone Encounter (Signed)
-----   Message from Antoine Poche, MD sent at 07/08/2014  3:09 PM EDT ----- Echo looks good. Please order a GXT to further evaluate her chest pain sympptoms. Do not need to hold any meds  [name] MD

## 2014-07-12 NOTE — Telephone Encounter (Signed)
-----   Message from Jonathan F Branch, MD sent at 07/08/2014  3:09 PM EDT ----- Echo looks good. Please order a GXT to further evaluate her chest pain sympptoms. Do not need to hold any meds  J Branch MD 

## 2014-07-12 NOTE — Telephone Encounter (Signed)
Pt made aware of GXT scheduled for 3/29 @9 :15 at [facility]. Forwarded results to Dr. Lysbeth Galas

## 2014-07-19 ENCOUNTER — Ambulatory Visit (HOSPITAL_COMMUNITY)
Admission: RE | Admit: 2014-07-19 | Discharge: 2014-07-19 | Disposition: A | Discharge status: Home / Self Care | Payer: Medicaid Other | Source: Ambulatory Visit | Attending: Cardiology | Admitting: Cardiology

## 2014-07-19 DIAGNOSIS — R0609 Other forms of dyspnea: Secondary | ICD-10-CM | POA: Insufficient documentation

## 2014-07-19 DIAGNOSIS — R079 Chest pain, unspecified: Secondary | ICD-10-CM | POA: Insufficient documentation

## 2014-07-19 NOTE — Progress Notes (Addendum)
Stress Lab Nurses Notes - Angela Roth  Angela Roth 07/19/2014 Reason for doing test: Chest Pain and DOE Type of test: Regular GTX Nurse performing test: Parke Poisson, RN Nuclear Medicine Tech: Not Applicable Echo Tech: Not Applicable MD performing test: Teal Bontrager/K.Lyman Bishop NP Family MD: Nyland  Test explained and consent signed: Yes.   IV started: No IV started Symptoms: Fatigue,discomfort in legs and Dyspnea.  During recovery had some chest pressure, a " feeling of a weighted vest". Treatment/Intervention: None Reason test stopped: fatigue and reached target HR After recovery IV was: NA Patient to return to Nuc. Med at : NA Patient discharged: Home Patient's Condition upon discharge was: stable Comments: During test peak BP 150/93 & HR 157. Recovery BP 132/92 & HR 95.  Symptoms resolved in recovery. [name]   Patient exercised according to the Bruce protocol for 6 min 37 sec, achieving 8.90 METs. Resting heart rate increased from 88 bpm to 157 bpm (94% of THR) and resting bp increased from 125/92 up to 150/93. The test was stopped due to fatigue, the patient did not experience any chest pain. Baseline EKG showed NSR. Stress EKG showed no specific ischemic changes and no significant arrhythmias. O2 sats were 95-97% throughout the study.  Conclusions 1. Negative exercise stress test for ischemia 2. Duke treadmill score of 7 consistent with low risk 3. No evidence of oxygen desaturation during exercise 4. Very good exercise tolerance (110% of predicted based on age and gender)  [name] MD

## 2014-07-21 ENCOUNTER — Telehealth: Payer: Self-pay | Admitting: *Deleted

## 2014-07-21 NOTE — Telephone Encounter (Signed)
Stress test is normal, does not appear her symptoms are heart related. Further workup for noncardiac chest pain per her pcp. F/u with [place] in 6 months       [name] MD   ----- Message -----    From: Antoine Poche, MD    Sent: 07/19/2014  1:18 PM     To: Antoine Poche, MD   Subject: Inpatient Notes       Patient notified. All questions answered. Patient voiced understanding.

## 2014-08-16 DIAGNOSIS — H6501 Acute serous otitis media, right ear: Secondary | ICD-10-CM | POA: Insufficient documentation

## 2015-11-18 ENCOUNTER — Encounter (HOSPITAL_COMMUNITY)
Admission: EM | Disposition: A | Discharge status: Home / Self Care | Payer: Self-pay | Source: Home / Self Care | Attending: Interventional Cardiology

## 2015-11-18 ENCOUNTER — Ambulatory Visit (HOSPITAL_COMMUNITY): Admit: 2015-11-18 | Payer: Self-pay | Admitting: Interventional Cardiology

## 2015-11-18 ENCOUNTER — Inpatient Hospital Stay (HOSPITAL_COMMUNITY)
Admission: EM | Admit: 2015-11-18 | Discharge: 2015-11-20 | DRG: 247 | Disposition: A | Discharge status: Home / Self Care | Payer: Medicaid Other | Attending: Interventional Cardiology | Admitting: Interventional Cardiology

## 2015-11-18 DIAGNOSIS — Z7982 Long term (current) use of aspirin: Secondary | ICD-10-CM | POA: Diagnosis not present

## 2015-11-18 DIAGNOSIS — I2510 Atherosclerotic heart disease of native coronary artery without angina pectoris: Secondary | ICD-10-CM

## 2015-11-18 DIAGNOSIS — M797 Fibromyalgia: Secondary | ICD-10-CM | POA: Diagnosis present

## 2015-11-18 DIAGNOSIS — K219 Gastro-esophageal reflux disease without esophagitis: Secondary | ICD-10-CM | POA: Diagnosis present

## 2015-11-18 DIAGNOSIS — M1990 Unspecified osteoarthritis, unspecified site: Secondary | ICD-10-CM | POA: Diagnosis present

## 2015-11-18 DIAGNOSIS — I2119 ST elevation (STEMI) myocardial infarction involving other coronary artery of inferior wall: Principal | ICD-10-CM

## 2015-11-18 DIAGNOSIS — F329 Major depressive disorder, single episode, unspecified: Secondary | ICD-10-CM | POA: Diagnosis present

## 2015-11-18 DIAGNOSIS — F419 Anxiety disorder, unspecified: Secondary | ICD-10-CM | POA: Diagnosis present

## 2015-11-18 DIAGNOSIS — E039 Hypothyroidism, unspecified: Secondary | ICD-10-CM | POA: Diagnosis present

## 2015-11-18 DIAGNOSIS — Z79899 Other long term (current) drug therapy: Secondary | ICD-10-CM | POA: Diagnosis not present

## 2015-11-18 DIAGNOSIS — Z720 Tobacco use: Secondary | ICD-10-CM | POA: Diagnosis present

## 2015-11-18 DIAGNOSIS — E785 Hyperlipidemia, unspecified: Secondary | ICD-10-CM | POA: Diagnosis present

## 2015-11-18 DIAGNOSIS — I213 ST elevation (STEMI) myocardial infarction of unspecified site: Secondary | ICD-10-CM | POA: Insufficient documentation

## 2015-11-18 DIAGNOSIS — I2111 ST elevation (STEMI) myocardial infarction involving right coronary artery: Secondary | ICD-10-CM

## 2015-11-18 DIAGNOSIS — Z955 Presence of coronary angioplasty implant and graft: Secondary | ICD-10-CM

## 2015-11-18 DIAGNOSIS — I10 Essential (primary) hypertension: Secondary | ICD-10-CM | POA: Diagnosis present

## 2015-11-18 DIAGNOSIS — R079 Chest pain, unspecified: Secondary | ICD-10-CM | POA: Diagnosis present

## 2015-11-18 DIAGNOSIS — F17210 Nicotine dependence, cigarettes, uncomplicated: Secondary | ICD-10-CM | POA: Diagnosis present

## 2015-11-18 HISTORY — PX: CARDIAC CATHETERIZATION: SHX172

## 2015-11-18 LAB — BRAIN NATRIURETIC PEPTIDE: B NATRIURETIC PEPTIDE 5: 33.2 pg/mL (ref 0.0–100.0)

## 2015-11-18 LAB — DIFFERENTIAL
BASOS PCT: 0 %
Basophils Absolute: 0 10*3/uL (ref 0.0–0.1)
EOS PCT: 0 %
Eosinophils Absolute: 0 10*3/uL (ref 0.0–0.7)
LYMPHS ABS: 1.5 10*3/uL (ref 0.7–4.0)
Lymphocytes Relative: 12 %
Monocytes Absolute: 0.5 10*3/uL (ref 0.1–1.0)
Monocytes Relative: 4 %
Neutro Abs: 10.4 10*3/uL — ABNORMAL HIGH (ref 1.7–7.7)
Neutrophils Relative %: 84 %

## 2015-11-18 LAB — COMPREHENSIVE METABOLIC PANEL
ALK PHOS: 61 U/L (ref 38–126)
ALT: 18 U/L (ref 14–54)
ANION GAP: 10 (ref 5–15)
AST: 41 U/L (ref 15–41)
Albumin: 3.4 g/dL — ABNORMAL LOW (ref 3.5–5.0)
BILIRUBIN TOTAL: 0.6 mg/dL (ref 0.3–1.2)
BUN: 5 mg/dL — ABNORMAL LOW (ref 6–20)
CALCIUM: 8.1 mg/dL — AB (ref 8.9–10.3)
CO2: 18 mmol/L — ABNORMAL LOW (ref 22–32)
Chloride: 106 mmol/L (ref 101–111)
Creatinine, Ser: 0.68 mg/dL (ref 0.44–1.00)
GFR calc Af Amer: >60 mL/min (ref >60)
Glucose, Bld: 107 mg/dL — ABNORMAL HIGH (ref 65–99)
POTASSIUM: 3.7 mmol/L (ref 3.5–5.1)
Sodium: 134 mmol/L — ABNORMAL LOW (ref 135–145)
Total Protein: 5.9 g/dL — ABNORMAL LOW (ref 6.5–8.1)

## 2015-11-18 LAB — I-STAT CHEM 8, ED
BUN: 8 mg/dL (ref 6–20)
Calcium, Ion: 0.99 mmol/L — ABNORMAL LOW (ref 1.13–1.30)
Chloride: 105 mmol/L (ref 101–111)
Creatinine, Ser: 0.5 mg/dL (ref 0.44–1.00)
Glucose, Bld: 125 mg/dL — ABNORMAL HIGH (ref 65–99)
HEMATOCRIT: 41 % (ref 36.0–46.0)
Hemoglobin: 13.9 g/dL (ref 12.0–15.0)
Potassium: 3.7 mmol/L (ref 3.5–5.1)
SODIUM: 140 mmol/L (ref 135–145)
TCO2: 20 mmol/L (ref 0–100)

## 2015-11-18 LAB — MRSA PCR SCREENING: MRSA BY PCR: NEGATIVE

## 2015-11-18 LAB — LIPID PANEL
CHOL/HDL RATIO: 7.8 ratio
Cholesterol: 251 mg/dL — ABNORMAL HIGH (ref 0–200)
HDL: 32 mg/dL — ABNORMAL LOW (ref >40)
LDL CALC: 182 mg/dL — AB (ref 0–99)
Triglycerides: 184 mg/dL — ABNORMAL HIGH (ref <150)
VLDL: 37 mg/dL (ref 0–40)

## 2015-11-18 LAB — CBC
HEMATOCRIT: 37.2 % (ref 36.0–46.0)
Hemoglobin: 12.3 g/dL (ref 12.0–15.0)
MCH: 30.7 pg (ref 26.0–34.0)
MCHC: 33.1 g/dL (ref 30.0–36.0)
MCV: 92.8 fL (ref 78.0–100.0)
Platelets: 277 10*3/uL (ref 150–400)
RBC: 4.01 MIL/uL (ref 3.87–5.11)
RDW: 13.3 % (ref 11.5–15.5)
WBC: 12.5 10*3/uL — ABNORMAL HIGH (ref 4.0–10.5)

## 2015-11-18 LAB — POCT ACTIVATED CLOTTING TIME: Activated Clotting Time: 802 seconds

## 2015-11-18 LAB — TSH: TSH: 2.29 u[IU]/mL (ref 0.350–4.500)

## 2015-11-18 LAB — APTT: aPTT: 116 seconds — ABNORMAL HIGH (ref 24–36)

## 2015-11-18 SURGERY — LEFT HEART CATH AND CORONARY ANGIOGRAPHY

## 2015-11-18 MED ORDER — SODIUM CHLORIDE 0.9 % IV SOLN: 4.0000 ug/kg/min | INTRAVENOUS | Status: DC

## 2015-11-18 MED ORDER — FENTANYL CITRATE (PF) 100 MCG/2ML IJ SOLN
INTRAMUSCULAR | Status: AC
  Filled 2015-11-18: qty 2

## 2015-11-18 MED ORDER — NITROGLYCERIN 1 MG/10 ML FOR IR/CATH LAB
INTRA_ARTERIAL | Status: DC | PRN
  Administered 2015-11-18: 200 ug via INTRA_ARTERIAL
  Administered 2015-11-18 (×2): 200 ug via INTRACORONARY

## 2015-11-18 MED ORDER — CANGRELOR TETRASODIUM 50 MG IV SOLR
INTRAVENOUS | Status: AC
  Filled 2015-11-18: qty 50

## 2015-11-18 MED ORDER — ASPIRIN 81 MG PO CHEW
81.0000 mg | CHEWABLE_TABLET | Freq: Every day | ORAL | Status: DC
  Administered 2015-11-19 – 2015-11-20 (×2): 81 mg via ORAL
  Filled 2015-11-18 (×2): qty 1

## 2015-11-18 MED ORDER — IOPAMIDOL (ISOVUE-370) INJECTION 76%
INTRAVENOUS | Status: AC
  Filled 2015-11-18: qty 125

## 2015-11-18 MED ORDER — ONDANSETRON HCL 4 MG/2ML IJ SOLN
4.0000 mg | Freq: Four times a day (QID) | INTRAMUSCULAR | Status: DC | PRN
  Administered 2015-11-19: 4 mg via INTRAVENOUS
  Filled 2015-11-18: qty 2

## 2015-11-18 MED ORDER — OXYCODONE-ACETAMINOPHEN 5-325 MG PO TABS
1.0000 | ORAL_TABLET | ORAL | Status: DC | PRN
  Administered 2015-11-18 – 2015-11-19 (×5): 2 via ORAL
  Filled 2015-11-18 (×5): qty 2

## 2015-11-18 MED ORDER — SODIUM CHLORIDE 0.9 % IV SOLN
10.0000 mL/h | INTRAVENOUS | Status: DC
  Administered 2015-11-18: 250 mL via INTRAVENOUS

## 2015-11-18 MED ORDER — MIDAZOLAM HCL 2 MG/2ML IJ SOLN
INTRAMUSCULAR | Status: AC
  Filled 2015-11-18: qty 2

## 2015-11-18 MED ORDER — HEPARIN (PORCINE) IN NACL 2-0.9 UNIT/ML-% IJ SOLN
INTRAMUSCULAR | Status: AC
  Filled 2015-11-18: qty 1000

## 2015-11-18 MED ORDER — TICAGRELOR 90 MG PO TABS
ORAL_TABLET | ORAL | Status: AC
  Filled 2015-11-18: qty 1

## 2015-11-18 MED ORDER — TICAGRELOR 90 MG PO TABS
90.0000 mg | ORAL_TABLET | Freq: Two times a day (BID) | ORAL | Status: DC
  Administered 2015-11-18 – 2015-11-20 (×4): 90 mg via ORAL
  Filled 2015-11-18 (×4): qty 1

## 2015-11-18 MED ORDER — LIDOCAINE HCL (PF) 1 % IJ SOLN
INTRAMUSCULAR | Status: AC
  Filled 2015-11-18: qty 30

## 2015-11-18 MED ORDER — BIVALIRUDIN 250 MG IV SOLR
INTRAVENOUS | Status: AC
Start: 2015-11-18 — End: 2015-11-18
  Filled 2015-11-18: qty 250

## 2015-11-18 MED ORDER — HEPARIN (PORCINE) IN NACL 2-0.9 UNIT/ML-% IJ SOLN
INTRAMUSCULAR | Status: DC | PRN
  Administered 2015-11-18: 13:00:00

## 2015-11-18 MED ORDER — NITROGLYCERIN 1 MG/10 ML FOR IR/CATH LAB
INTRA_ARTERIAL | Status: AC
  Filled 2015-11-18: qty 10

## 2015-11-18 MED ORDER — HEPARIN SODIUM (PORCINE) 1000 UNIT/ML IJ SOLN
INTRAMUSCULAR | Status: AC
  Filled 2015-11-18: qty 1

## 2015-11-18 MED ORDER — MORPHINE SULFATE (PF) 2 MG/ML IV SOLN
INTRAVENOUS | Status: AC
  Administered 2015-11-18: 2 mg via INTRAVENOUS
  Filled 2015-11-18: qty 1

## 2015-11-18 MED ORDER — CANGRELOR BOLUS VIA INFUSION
INTRAVENOUS | Status: DC | PRN
  Administered 2015-11-18: 1980 ug via INTRAVENOUS

## 2015-11-18 MED ORDER — ACETAMINOPHEN 325 MG PO TABS
650.0000 mg | ORAL_TABLET | ORAL | Status: DC | PRN
  Administered 2015-11-19 (×2): 650 mg via ORAL
  Filled 2015-11-18 (×2): qty 2

## 2015-11-18 MED ORDER — LISINOPRIL 10 MG PO TABS
10.0000 mg | ORAL_TABLET | Freq: Every day | ORAL | Status: DC
  Administered 2015-11-19: 10 mg via ORAL
  Filled 2015-11-18: qty 1

## 2015-11-18 MED ORDER — HEPARIN SODIUM (PORCINE) 1000 UNIT/ML IJ SOLN
INTRAMUSCULAR | Status: DC | PRN
  Administered 2015-11-18: 4000 [IU] via INTRAVENOUS

## 2015-11-18 MED ORDER — VERAPAMIL HCL 2.5 MG/ML IV SOLN
INTRAVENOUS | Status: DC | PRN
  Administered 2015-11-18: 11:00:00 via INTRA_ARTERIAL

## 2015-11-18 MED ORDER — HEPARIN SODIUM (PORCINE) 5000 UNIT/ML IJ SOLN
4000.0000 [IU] | INTRAMUSCULAR | Status: DC
  Filled 2015-11-18: qty 1

## 2015-11-18 MED ORDER — MORPHINE SULFATE (PF) 2 MG/ML IV SOLN
INTRAVENOUS | Status: AC
  Filled 2015-11-18: qty 2

## 2015-11-18 MED ORDER — SODIUM CHLORIDE 0.9 % IV SOLN
INTRAVENOUS | Status: DC | PRN
  Administered 2015-11-18: 150 mL/h via INTRAVENOUS

## 2015-11-18 MED ORDER — HEPARIN SODIUM (PORCINE) 5000 UNIT/ML IJ SOLN
5000.0000 [IU] | Freq: Three times a day (TID) | INTRAMUSCULAR | Status: DC
  Administered 2015-11-18 – 2015-11-20 (×5): 5000 [IU] via SUBCUTANEOUS
  Filled 2015-11-18 (×5): qty 1

## 2015-11-18 MED ORDER — FENTANYL CITRATE (PF) 100 MCG/2ML IJ SOLN
INTRAMUSCULAR | Status: DC | PRN
  Administered 2015-11-18: 25 ug via INTRAVENOUS
  Administered 2015-11-18 (×2): 50 ug via INTRAVENOUS

## 2015-11-18 MED ORDER — SODIUM CHLORIDE 0.9 % IV SOLN
INTRAVENOUS | Status: DC | PRN
  Administered 2015-11-18: 4 ug/kg/min via INTRAVENOUS

## 2015-11-18 MED ORDER — BIVALIRUDIN 250 MG IV SOLR
INTRAVENOUS | Status: AC
  Filled 2015-11-18: qty 250

## 2015-11-18 MED ORDER — IOPAMIDOL (ISOVUE-370) INJECTION 76%
INTRAVENOUS | Status: AC
  Filled 2015-11-18: qty 50

## 2015-11-18 MED ORDER — CARVEDILOL 3.125 MG PO TABS
3.1250 mg | ORAL_TABLET | Freq: Two times a day (BID) | ORAL | Status: DC
  Administered 2015-11-18 – 2015-11-20 (×4): 3.125 mg via ORAL
  Filled 2015-11-18 (×4): qty 1

## 2015-11-18 MED ORDER — ATROPINE SULFATE 1 MG/10ML IJ SOSY
PREFILLED_SYRINGE | INTRAMUSCULAR | Status: AC
  Filled 2015-11-18: qty 10

## 2015-11-18 MED ORDER — LIDOCAINE HCL (PF) 1 % IJ SOLN
INTRAMUSCULAR | Status: DC | PRN
  Administered 2015-11-18: 5 mL via SUBCUTANEOUS
  Administered 2015-11-18: 30 mL via SUBCUTANEOUS

## 2015-11-18 MED ORDER — SODIUM CHLORIDE 0.9% FLUSH: 3.0000 mL | INTRAVENOUS | Status: DC | PRN

## 2015-11-18 MED ORDER — MORPHINE SULFATE (PF) 2 MG/ML IV SOLN
2.0000 mg | INTRAVENOUS | Status: DC | PRN
  Administered 2015-11-18 – 2015-11-19 (×4): 2 mg via INTRAVENOUS
  Filled 2015-11-18 (×3): qty 1

## 2015-11-18 MED ORDER — IOPAMIDOL (ISOVUE-370) INJECTION 76%
INTRAVENOUS | Status: DC | PRN
  Administered 2015-11-18: 290 mL

## 2015-11-18 MED ORDER — IOPAMIDOL (ISOVUE-370) INJECTION 76%
INTRAVENOUS | Status: AC
  Filled 2015-11-18: qty 100

## 2015-11-18 MED ORDER — BIVALIRUDIN BOLUS VIA INFUSION - CUPID
INTRAVENOUS | Status: DC | PRN
  Administered 2015-11-18: 49.5 mg via INTRAVENOUS

## 2015-11-18 MED ORDER — TICAGRELOR 90 MG PO TABS
ORAL_TABLET | ORAL | Status: DC | PRN
  Administered 2015-11-18: 180 mg via ORAL

## 2015-11-18 MED ORDER — SODIUM CHLORIDE 0.9 % IV SOLN: 250.0000 mL | INTRAVENOUS | Status: DC | PRN

## 2015-11-18 MED ORDER — SODIUM CHLORIDE 0.9 % WEIGHT BASED INFUSION
1.0000 mL/kg/h | INTRAVENOUS | Status: AC
  Administered 2015-11-18: 1 mL/kg/h via INTRAVENOUS

## 2015-11-18 MED ORDER — SODIUM CHLORIDE 0.9 % IV SOLN
INTRAVENOUS | Status: DC | PRN
  Administered 2015-11-18 (×2): 1.75 mg/kg/h via INTRAVENOUS

## 2015-11-18 MED ORDER — MIDAZOLAM HCL 2 MG/2ML IJ SOLN
INTRAMUSCULAR | Status: DC | PRN
  Administered 2015-11-18 (×3): 1 mg via INTRAVENOUS

## 2015-11-18 MED ORDER — SODIUM CHLORIDE 0.9% FLUSH
3.0000 mL | Freq: Two times a day (BID) | INTRAVENOUS | Status: DC
  Administered 2015-11-19 – 2015-11-20 (×3): 3 mL via INTRAVENOUS

## 2015-11-18 MED ORDER — ATORVASTATIN CALCIUM 80 MG PO TABS
80.0000 mg | ORAL_TABLET | Freq: Every day | ORAL | Status: DC
  Administered 2015-11-18 – 2015-11-19 (×2): 80 mg via ORAL
  Filled 2015-11-18 (×2): qty 1

## 2015-11-18 MED ORDER — VERAPAMIL HCL 2.5 MG/ML IV SOLN
INTRAVENOUS | Status: AC
  Filled 2015-11-18: qty 2

## 2015-11-18 SURGICAL SUPPLY — 23 items
BALLN EMERGE MR 2.5X15 (BALLOONS) ×6
BALLN ~~LOC~~ EMERGE MR 3.75X12 (BALLOONS) ×3
BALLOON EMERGE MR 2.5X15 (BALLOONS) IMPLANT
BALLOON ~~LOC~~ EMERGE MR 3.75X12 (BALLOONS) IMPLANT
CATH INFINITI 5FR JL4 (CATHETERS) ×2 IMPLANT
CATH SITESEER 5F MULTI A 2 (CATHETERS) ×2 IMPLANT
CATH VISTA GUIDE 6FR JR4 (CATHETERS) ×2 IMPLANT
DEVICE RAD COMP TR BAND LRG (VASCULAR PRODUCTS) ×2 IMPLANT
GLIDESHEATH SLEND A-KIT 6F 22G (SHEATH) ×2 IMPLANT
KIT ENCORE 26 ADVANTAGE (KITS) ×2 IMPLANT
KIT HEART LEFT (KITS) ×3 IMPLANT
PACK CARDIAC CATHETERIZATION (CUSTOM PROCEDURE TRAY) ×3 IMPLANT
SHEATH PINNACLE 6F 10CM (SHEATH) ×2 IMPLANT
STENT PROMUS PREM MR 3.0X20 (Permanent Stent) ×2 IMPLANT
STENT PROMUS PREM MR 3.5X12 (Permanent Stent) ×3 IMPLANT
STENT PROMUS PREM MR 3.5X28 (Permanent Stent) ×2 IMPLANT
TRANSDUCER W/STOPCOCK (MISCELLANEOUS) ×3 IMPLANT
TUBING CIL FLEX 10 FLL-RA (TUBING) ×3 IMPLANT
WIRE ASAHI PROWATER 180CM (WIRE) ×3 IMPLANT
WIRE EMERALD 3MM-J .035X150CM (WIRE) ×2 IMPLANT
WIRE HI TORQ BMW 190CM (WIRE) ×2 IMPLANT
WIRE HI TORQ VERSACORE-J 145CM (WIRE) ×2 IMPLANT
WIRE SAFE-T 1.5MM-J .035X260CM (WIRE) ×2 IMPLANT

## 2015-11-18 NOTE — H&P (Signed)
Angela Roth is a 55 y.o. female  Admit Date: 11/18/2015 Referring Physician: EMS Primary Cardiologist:: New Chief complaint / reason for admission: Inferior ST elevation MI  HPI: 55 year old female who developed chest discomfort at around 9:30 on the day of admission while drinking coffee. EMS was eventually summoned and transmitted EKG demonstrated inferior ST elevation. Upon arrival in the emergency room the patient was very anxious, complaining of severe chest discomfort, and dyspnea. No prior history of such chest discomfort. The discomfort has been unremitting since starting. She quantifies the pain is 10 over 10.  History of hypertension, hyperlipidemia, tobacco abuse, and family history of premature atherosclerosis. No history of diabetes.  PMH:    Past Medical History:  Diagnosis Date  . Arthritis   . Chronic headaches   . Colitis   . Depression   . Essential hypertension   . Fibromyalgia   . GERD (gastroesophageal reflux disease)   . Hyperlipidemia   . Hypothyroidism     PSH:    Past Surgical History:  Procedure Laterality Date  . BACK SURGERY    . CHOLECYSTECTOMY    . TOTAL ABDOMINAL HYSTERECTOMY W/ BILATERAL SALPINGOOPHORECTOMY     ALLERGIES:   Indomethacin Prior to Admit Meds:   Prescriptions Prior to Admission  Medication Sig Dispense Refill Last Dose  . aspirin 81 MG tablet Take 81 mg by mouth daily.   Taking  . cyclobenzaprine (FLEXERIL) 10 MG tablet Take 10 mg by mouth 3 (three) times daily as needed.   Taking  . ibuprofen (ADVIL,MOTRIN) 200 MG tablet Take 200 mg by mouth every 6 (six) hours as needed.   Taking  . lisinopril (PRINIVIL,ZESTRIL) 10 MG tablet Take 10 mg by mouth daily.   Taking  . LORazepam (ATIVAN) 1 MG tablet Take 1 tablet (1 mg total) by mouth every 6 (six) hours as needed for anxiety. 10 tablet 0 Taking  . nitroGLYCERIN (NITROSTAT) 0.4 MG SL tablet Place 1 tablet (0.4 mg total) under the tongue every 5 (five) minutes as needed for chest  pain. 25 tablet 3 Taking   Family HX:    Family History  Problem Relation Age of Onset  . Lung cancer Maternal Grandmother   . Ovarian cancer Maternal Grandmother   . Asthma Brother    Social HX:    Social History   Social History  . Marital status: Divorced    Spouse name: N/A  . Number of children: N/A  . Years of education: N/A   Occupational History  . Not on file.   Social History Main Topics  . Smoking status: Current Every Day Smoker    Packs/day: 1.00    Years: 42.00    Types: Cigarettes    Start date: 09/12/1971  . Smokeless tobacco: Never Used  . Alcohol use No  . Drug use: No  . Sexual activity: Not on file   Other Topics Concern  . Not on file   Social History Narrative  . No narrative on file     ROS Chronic back pain for which he uses nonsteroidal anti-inflammatory therapy. History of anxiety. All other systems are negative.  Physical Exam: Blood pressure (!) 145/86, pulse 90, resp. rate 15, SpO2 99 %.    Obese, anxious, and writhing around in severe pain on the emergency room stretcher. Skin is clear. Neck exam reveals no JVD or carotid bruits. Lungs are clear to auscultation and percussion. Cardiac exam reveals no gallop or murmur. Abdomen is soft. Bowel sounds are  normal. Extremities reveal no edema. Femoral pulses and radial pulses are 2+ and bounding. Neurological exam reveals a very anxious person with no focal deficits.   Labs: Lab Results  Component Value Date   WBC 11.1 (H) 01/30/2014   HGB 13.9 11/18/2015   HCT 41.0 11/18/2015   MCV 91.3 01/30/2014   PLT 338 01/30/2014    Recent Labs Lab 11/18/15 1114  NA 140  K 3.7  CL 105  BUN 8  CREATININE 0.50  GLUCOSE 125*   Lab Results  Component Value Date   TROPONINI <0.30 01/30/2014      Radiology:  No results found.  EKG:  Inferior ST elevation compatible with infarction with anterior reciprocal changes.   ASSESSMENT:  1. Acute inferior ST elevation myocardial  infarction with ongoing severe pain. Duration around 9:30 AM.  2. History of hypertension  3. History of tobacco abuse  4. Hyperlipidemia    Plan:  1. Emergency catheterization to define anatomy and guide therapy in setting of inferior ST elevation infarction. Discussed with patient.   Lyn Records III 11/18/2015 1:27 PM

## 2015-11-18 NOTE — ED Notes (Signed)
Pt given 4000u of heparin , 3mg  of morphine, and bag of saline hung , pt placed on zoll and transported to cath lab , report given to cath lab

## 2015-11-18 NOTE — Op Note (Signed)
   Acute intervention in the setting of inferior STEMI.  Complicated procedure due to elongated and tortuous aorta. Initial approach was right radial, but due to spasm, and difficulty with torque and catheter control of the procedure was switched over to right femoral.  Severe multifocal native right coronary with total occlusion distally and also significant ostial and mid obstruction.  Ultimate successful procedure with ostial mid and distal drug-eluting stents placed with a very nice final result. Please see full report for details. No other significant coronary disease. Overall normal LV function.

## 2015-11-18 NOTE — Progress Notes (Signed)
[facility] responded to code STEMI. Pt arrived and was taken to trauma C. After examination was taken to Cath lab. Son was on scene and assumed to be in rout  but was unable to contact. [facility] informed nurse I am available for when the family arrives and asked them to page me.   Lorne Skeens Marlean Mortell    11/18/15 1100  Clinical Encounter Type  Visited With Patient;Health care provider  Visit Type Initial;Spiritual support;Code;ED (STEMI)  Referral From Nurse  Spiritual Encounters  Spiritual Needs Prayer;Emotional;Grief support

## 2015-11-18 NOTE — Progress Notes (Addendum)
TR band removed, no bleeding or hematoma present.  Arterial sheath removed. Manual pressure applied for 20 minutes. No bleeding or hematoma present. Pt on bedrest until 2145 and educated on mobility precautions.   Dr. Katrinka Blazing at the bedside checking on pt. She developed 8/10 chest pain. 2mg  of morphine given with some relief. She is also having short runs of VT. Will continue to monitor.

## 2015-11-18 NOTE — ED Provider Notes (Signed)
[facility]-EMERGENCY DEPT Provider Note   CSN: [id number] Arrival date & time:     First Provider Contact:  First MD Initiated Contact with Patient 11/18/15 1107        History   Chief Complaint No chief complaint on file.   HPI Angela Roth is a 55 y.o. female.  Level 5 caveat for acuity of condition.  STEMI called by EMS.  Patient developed central chest pain rating to her left shoulder that has been constant throughout this morning while she was watching TV. She says she's had the pain on and off for several days but today it is constant. Associated with shortness of breath and nausea. Pain radiates to her left shoulder and neck. No history of known CAD but she does have nitroglycerin at home. She did not take any today. Denies any cough or fever. Denies any back pain or abdominal pain.   The history is provided by the patient and the EMS personnel. The history is limited by the condition of the patient.    Past Medical History:  Diagnosis Date  . Arthritis   . Chronic headaches   . Colitis   . Depression   . Essential hypertension   . Fibromyalgia   . GERD (gastroesophageal reflux disease)   . Hyperlipidemia   . Hypothyroidism     Patient Active Problem List   Diagnosis Date Noted  . HYPERLIPIDEMIA 11/09/2009  . Tobacco abuse 11/09/2009  . Essential hypertension 11/09/2009  . CHRONIC RHINITIS 11/09/2009  . DYSPNEA 11/09/2009    Past Surgical History:  Procedure Laterality Date  . BACK SURGERY    . CHOLECYSTECTOMY    . TOTAL ABDOMINAL HYSTERECTOMY W/ BILATERAL SALPINGOOPHORECTOMY      OB History    No data available       Home Medications    Prior to Admission medications   Medication Sig Start Date End Date Taking? Authorizing Provider  aspirin 81 MG tablet Take 81 mg by mouth daily.    Historical Provider, MD  cyclobenzaprine (FLEXERIL) 10 MG tablet Take 10 mg by mouth 3 (three) times daily as needed.    Historical Provider, MD  ibuprofen  (ADVIL,MOTRIN) 200 MG tablet Take 200 mg by mouth every 6 (six) hours as needed.    Historical Provider, MD  lisinopril (PRINIVIL,ZESTRIL) 10 MG tablet Take 10 mg by mouth daily. 06/10/14 06/10/15  Historical Provider, MD  LORazepam (ATIVAN) 1 MG tablet Take 1 tablet (1 mg total) by mouth every 6 (six) hours as needed for anxiety. 01/30/14   Geoffery Lyons, MD  nitroGLYCERIN (NITROSTAT) 0.4 MG SL tablet Place 1 tablet (0.4 mg total) under the tongue every 5 (five) minutes as needed for chest pain. 06/30/14   Antoine Poche, MD    Family History Family History  Problem Relation Age of Onset  . Lung cancer Maternal Grandmother   . Ovarian cancer Maternal Grandmother   . Asthma Brother     Social History Social History  Substance Use Topics  . Smoking status: Current Every Day Smoker    Packs/day: 1.00    Years: 42.00    Types: Cigarettes    Start date: 09/12/1971  . Smokeless tobacco: Never Used  . Alcohol use No     Allergies   Indomethacin   Review of Systems Review of Systems  Unable to perform ROS: Acuity of condition     Physical Exam Updated Vital Signs There were no vitals taken for this visit.  Physical Exam  Constitutional:  She is oriented to person, place, and time. She appears well-developed and well-nourished. She appears distressed.  HENT:  Head: Normocephalic and atraumatic.  Mouth/Throat: Oropharynx is clear and moist. No oropharyngeal exudate.  Eyes: Conjunctivae and EOM are normal. Pupils are equal, round, and reactive to light.  Neck: Normal range of motion. Neck supple.  No meningismus.  Cardiovascular: Normal rate, regular rhythm, normal heart sounds and intact distal pulses.   No murmur heard. Pulmonary/Chest: Effort normal and breath sounds normal. No respiratory distress. She exhibits no tenderness.  Abdominal: Soft. There is no tenderness. There is no rebound and no guarding.  Musculoskeletal: Normal range of motion. She exhibits no edema or  tenderness.  Neurological: She is alert and oriented to person, place, and time. No cranial nerve deficit. She exhibits normal muscle tone. Coordination normal.  No ataxia on finger to nose bilaterally. No pronator drift. 5/5 strength throughout. CN 2-12 intact.Equal grip strength. Sensation intact.   Skin: Skin is warm.  Psychiatric: She has a normal mood and affect. Her behavior is normal.  Nursing note and vitals reviewed.    ED Treatments / Results  Labs (all labs ordered are listed, but only abnormal results are displayed) Labs Reviewed  CBC  DIFFERENTIAL  PROTIME-INR  APTT  COMPREHENSIVE METABOLIC PANEL  TROPONIN I  LIPID PANEL    EKG  EKG Interpretation None       Radiology No results found.  Procedures Procedures (including critical care time)  Medications Ordered in ED Medications  0.9 %  sodium chloride infusion (not administered)  heparin injection 4,000 Units (not administered)     Initial Impression / Assessment and Plan / ED Course  I have reviewed the triage vital signs and the nursing notes.  Pertinent labs & imaging results that were available during my care of the patient were reviewed by me and considered in my medical decision making (see chart for details).  Clinical Course   Code STEMI via EMS. Patient's airway is stable. Vitals are stable. Aspirin are given.  She'll be loaded with heparin. Labs obtained. Dr. Katrinka Blazing at bedside. Patient emergently to Cath Lab.    Final Clinical Impressions(s) / ED Diagnoses   Final diagnoses:  ST elevation myocardial infarction (STEMI), unspecified artery [facility])    New Prescriptions New Prescriptions   No medications on file     Glynn Octave, MD 11/18/15 1608

## 2015-11-19 ENCOUNTER — Encounter (HOSPITAL_COMMUNITY): Payer: Self-pay

## 2015-11-19 ENCOUNTER — Inpatient Hospital Stay (HOSPITAL_COMMUNITY): Payer: Medicaid Other

## 2015-11-19 DIAGNOSIS — E785 Hyperlipidemia, unspecified: Secondary | ICD-10-CM

## 2015-11-19 DIAGNOSIS — Z720 Tobacco use: Secondary | ICD-10-CM

## 2015-11-19 LAB — COMPREHENSIVE METABOLIC PANEL
ALBUMIN: 3.4 g/dL — AB (ref 3.5–5.0)
ALT: 37 U/L (ref 14–54)
AST: 85 U/L — AB (ref 15–41)
Alkaline Phosphatase: 70 U/L (ref 38–126)
Anion gap: 7 (ref 5–15)
CHLORIDE: 106 mmol/L (ref 101–111)
CO2: 26 mmol/L (ref 22–32)
CREATININE: 0.65 mg/dL (ref 0.44–1.00)
Calcium: 8.6 mg/dL — ABNORMAL LOW (ref 8.9–10.3)
GFR calc Af Amer: >60 mL/min (ref >60)
GFR calc non Af Amer: >60 mL/min (ref >60)
GLUCOSE: 109 mg/dL — AB (ref 65–99)
Potassium: 3.9 mmol/L (ref 3.5–5.1)
SODIUM: 139 mmol/L (ref 135–145)
Total Bilirubin: 1 mg/dL (ref 0.3–1.2)
Total Protein: 5.7 g/dL — ABNORMAL LOW (ref 6.5–8.1)

## 2015-11-19 LAB — CBC
HCT: 38.1 % (ref 36.0–46.0)
Hemoglobin: 12.3 g/dL (ref 12.0–15.0)
MCH: 30.5 pg (ref 26.0–34.0)
MCHC: 32.3 g/dL (ref 30.0–36.0)
MCV: 94.5 fL (ref 78.0–100.0)
PLATELETS: 262 10*3/uL (ref 150–400)
RBC: 4.03 MIL/uL (ref 3.87–5.11)
RDW: 13.5 % (ref 11.5–15.5)
WBC: 10.8 10*3/uL — AB (ref 4.0–10.5)

## 2015-11-19 LAB — TROPONIN I
TROPONIN I: 10.58 ng/mL — AB (ref <0.03)
TROPONIN I: 7.13 ng/mL — AB (ref <0.03)
Troponin I: 8.18 ng/mL (ref <0.03)

## 2015-11-19 MED ORDER — NITROGLYCERIN 0.4 MG SL SUBL: 0.4000 mg | SUBLINGUAL_TABLET | SUBLINGUAL | Status: DC | PRN

## 2015-11-19 MED ORDER — NITROGLYCERIN 0.4 MG SL SUBL
SUBLINGUAL_TABLET | SUBLINGUAL | Status: AC
  Filled 2015-11-19: qty 1

## 2015-11-19 MED ORDER — ALUM & MAG HYDROXIDE-SIMETH 200-200-20 MG/5ML PO SUSP
30.0000 mL | ORAL | Status: DC | PRN
  Administered 2015-11-19: 30 mL via ORAL
  Filled 2015-11-19: qty 30

## 2015-11-19 NOTE — Progress Notes (Addendum)
Subjective:   55 y/o smoker with and HL admitted 7/29 with inferior STEMI.   Cath wth: LM: 40% LAD: 30% LCX: 75%mid, 70% distal RCA: 70% ostial, 80% mid, 100% distal LVEF 45-50%  Underwent PCI/DES x 3 to RCA. (ostial, mid, distal)  Mild CP this am but resolved. No ambulating room without CP or dyspnea. BP looks good. Trop 10.6 this am. LDL 182     Intake/Output Summary (Last 24 hours) at 11/19/15 1042 Last data filed at 11/19/15 0800  Gross per 24 hour  Intake          1093.79 ml  Output             1700 ml  Net          -606.21 ml    Current meds: . aspirin  81 mg Oral Daily  . atorvastatin  80 mg Oral q1800  . carvedilol  3.125 mg Oral BID WC  . heparin  5,000 Units Subcutaneous Q8H  . lisinopril  10 mg Oral Daily  . sodium chloride flush  3 mL Intravenous Q12H  . ticagrelor  90 mg Oral BID   Infusions:     Objective:  Blood pressure 111/82, pulse 67, temperature 98.7 F (37.1 C), temperature source Oral, resp. rate 13, height  (1.575 m), weight 72.5 kg (159 lb 13.3 oz), SpO2 99 %. Weight change:   Physical Exam: General:  Well appearing. No resp difficulty HEENT: normal Neck: supple. JVP 6-7 . Carotids 2+ bilat; no bruits. No lymphadenopathy or thryomegaly appreciated. Cor: PMI nondisplaced. Regular rate & rhythm. No rubs, gallops or murmurs. Lungs: clear with decreased air movement.  Abdomen: soft, nontender, nondistended. No hepatosplenomegaly. No bruits or masses. Good bowel sounds. Extremities: no cyanosis, clubbing, rash, edema r groin stie ok. No bruit.  Neuro: alert & orientedx3, cranial nerves grossly intact. moves all 4 extremities w/o difficulty. Affect pleasant  Telemetry: Sinus  Lab Results: Basic Metabolic Panel:  Recent Labs Lab 11/18/15 1114 11/18/15 1403 11/19/15 0523  NA 140 134* 139  K 3.7 3.7 3.9  CL 105 106 106  CO2  --  18* 26  GLUCOSE 125* 107* 109*  BUN 8 5* <5*  CREATININE 0.50 0.68 0.65  CALCIUM  --  8.1* 8.6*     Liver Function Tests:  Recent Labs Lab 11/18/15 1403 11/19/15 0523  AST 41 85*  ALT 18 37  ALKPHOS 61 70  BILITOT 0.6 1.0  PROT 5.9* 5.7*  ALBUMIN 3.4* 3.4*   No results for input(s): LIPASE, AMYLASE in the last 168 hours. No results for input(s): AMMONIA in the last 168 hours. CBC:  Recent Labs Lab 11/18/15 1114 11/18/15 1403 11/19/15 0523  WBC  --  12.5* 10.8*  NEUTROABS  --  10.4*  --   HGB 13.9 12.3 12.3  HCT 41.0 37.2 38.1  MCV  --  92.8 94.5  PLT  --  277 262   Cardiac Enzymes:  Recent Labs Lab 11/19/15 0523  TROPONINI 10.58*   BNP: Invalid input(s): POCBNP CBG: No results for input(s): GLUCAP in the last 168 hours. Microbiology: No results found for: CULT No results for input(s): CULT, SDES in the last 168 hours.  Imaging: No results found.   ASSESSMENT:  1. Inferior STEMI     --s/p PCI/DES to RCA x 3 on 7/29 2. CAD    --as described above 3. HTN 4. Tobacco use 5. HL    --LDL 182  PLAN/DISCUSSION:  Doing well  post-MI. Can go to floor. Continue DAPT. Will need aggressive RF management with ASA, b-blocker, statin and smoking cessation. CR consulted. Possible d/c in am.    LOS: 1 day    Arvilla Meres, MD 11/19/2015, 10:42 AM

## 2015-11-19 NOTE — Progress Notes (Signed)
Spoke with Dr. Katrinka Blazing in regards to patient's chest pain. Patient is stating that sitting up in bed is helping patient's pain. Will continue to monitor patient. Milon Dikes, RN

## 2015-11-19 NOTE — Progress Notes (Signed)
Patient complaining of worsening chest pressure and pain. Patient given morphine and percocet at this time and Cardiology has been paged. Will continue to monitor. Milon Dikes, RN 5:54 AM 11/19/2015

## 2015-11-19 NOTE — Progress Notes (Addendum)
Patient complaining of chest pressure. EKG obtained. No significant changes to EKG and vital signs stable at this time. Patient believes pain is due to indigestion at this time. Patient received Maalox. Cardiology notified at this time. Will continue to monitor patient for any changes. Milon Dikes, RN 11/19/2015 01:16 AM

## 2015-11-19 NOTE — Progress Notes (Signed)
Patient continuing to have chest pain at this time that is worsening. Blood pressures 90/50's. Will continue to monitor and administer SL nitroglycerin when patient blood pressure is stable. Cardiology has been paged at this time. Will continue to monitor. Milon Dikes, RN 6:32 AM 11/19/2015

## 2015-11-19 NOTE — Progress Notes (Signed)
Patient states that systems are getting better at this time with Maalox. Will continue to monitor patient at this time for any changes. Milon Dikes, RN

## 2015-11-19 NOTE — Progress Notes (Signed)
Patient arrived the unit from 2H, assessment completed see flowsheet, patient oriented to room and staff, bed in lowest position,call light within reach will continue to monitor.

## 2015-11-20 ENCOUNTER — Telehealth: Payer: Self-pay

## 2015-11-20 ENCOUNTER — Encounter (HOSPITAL_COMMUNITY): Payer: Self-pay | Admitting: Interventional Cardiology

## 2015-11-20 ENCOUNTER — Inpatient Hospital Stay (HOSPITAL_COMMUNITY): Payer: Medicaid Other

## 2015-11-20 DIAGNOSIS — R072 Precordial pain: Secondary | ICD-10-CM

## 2015-11-20 LAB — ECHOCARDIOGRAM COMPLETE
HEIGHTINCHES: 62 in
WEIGHTICAEL: 2557.34 [oz_av]

## 2015-11-20 LAB — TROPONIN I: TROPONIN I: 5.25 ng/mL — AB (ref <0.03)

## 2015-11-20 MED ORDER — ATORVASTATIN CALCIUM 80 MG PO TABS: 80.0000 mg | ORAL_TABLET | Freq: Every day | ORAL | 3 refills | Status: DC

## 2015-11-20 MED ORDER — ASPIRIN 81 MG PO CHEW: 81.0000 mg | CHEWABLE_TABLET | Freq: Every day | ORAL | Status: DC

## 2015-11-20 MED ORDER — CARVEDILOL 3.125 MG PO TABS: 3.1250 mg | ORAL_TABLET | Freq: Two times a day (BID) | ORAL | 11 refills | Status: DC

## 2015-11-20 MED ORDER — TICAGRELOR 90 MG PO TABS: 90.0000 mg | ORAL_TABLET | Freq: Two times a day (BID) | ORAL | 0 refills | Status: DC

## 2015-11-20 MED ORDER — TICAGRELOR 90 MG PO TABS: 90.0000 mg | ORAL_TABLET | Freq: Two times a day (BID) | ORAL | 11 refills | Status: DC

## 2015-11-20 NOTE — Telephone Encounter (Signed)
Patient contacted regarding discharge from [facility] on 11/20/15.  Patient understands to follow up with provider Dayna Dunn PA-C on 11/27/15 at 3 pm at [facility] OFFICE. Patient understands discharge instructions? Yes per spouse Patient understands medications and regiment? yes Patient understands to bring all medications to this visit? yes  Both home and cell number do not have vm set up.Will try patient again later,appears not to be discharged yet.

## 2015-11-20 NOTE — Discharge Instructions (Signed)
No driving for 24 hours. No lifting over 5 lbs for 1 week. No sexual activity for 1 week. Keep procedure site clean & dry. If you notice increased pain, swelling, bleeding or pus, call/return!  You may shower, but no soaking baths/hot tubs/pools for 1 week.  ° ° °

## 2015-11-20 NOTE — Care Management Note (Signed)
Case Management Note  Patient Details  Name: Raelin Deleo MRN: [id number] Date of Birth: 01-19-1961  Subjective/Objective:     Admitted with MI               Action/Plan: CM talked to patient about not being able to afford her medication; patient has Medicaid and stated that it had lapsed. Per Epic, it is showing active in the system. Brilinta coupon card given to patient as requested. Pharmacy of choice is [facility];  Expected Discharge Date:    possibly 11/20/2015              Expected Discharge Plan:  Home/Self Care  Discharge planning Services  CM Consult Choice offered to:  NA  Status of Service:  In process, will continue to follow  Reola Mosher [phone] 11/20/2015, 3:19 PM

## 2015-11-20 NOTE — Progress Notes (Signed)
  Echocardiogram 2D Echocardiogram has been performed.  Leta Jungling M 11/20/2015, 3:21 PM

## 2015-11-20 NOTE — Progress Notes (Signed)
CARDIAC REHAB PHASE I   PRE:  Rate/Rhythm: 92 SR    BP: sitting 100/70    SaO2:   MODE:  Ambulation: 450 ft   POST:  Rate/Rhythm: 102 ST    BP: sitting 104/74     SaO2:   Tolerated well, no c/o. Ed completed with pt, voicing understanding. She is concerned about finances as she does not have insurance (? Applied for Medicaid but has not heard from them). She understands the importance of Brilinta/ASA. She is planning to quit smoking and was very thankful for the fake cigarette. Will send referral to [facility].  [phone]   Harriet Masson CES, ACSM 11/20/2015 9:15 AM

## 2015-11-20 NOTE — Discharge Summary (Signed)
Discharge Summary    Patient ID: Angela Roth,  MRN: [id number], DOB/AGE: 24-Jan-1961 55 y.o.  Admit date: 11/18/2015 Discharge date: 11/20/2015  Primary Care Provider: Josue Hector Primary Cardiologist: Dr. Wyline Mood  Discharge Diagnoses    Principal Problem:   ST elevation (STEMI) myocardial infarction involving other coronary artery of inferior wall [facility]) Active Problems:   Hyperlipidemia   Tobacco abuse   Essential hypertension   Allergies Allergies  Allergen Reactions  . Indomethacin Other (See Comments)    Patient has bad side effects "suicidal thoughts/homicidal thoughts"    Diagnostic Studies/Procedures    Cath 11/18/2015 Conclusion     LM lesion, 40 %stenosed.  Prox Cx to Mid Cx lesion, 75 %stenosed.  Mid Cx to Dist Cx lesion, 70 %stenosed.  Prox LAD to Dist LAD lesion, 30 %stenosed.  The left ventricular systolic function is normal.  LV end diastolic pressure is normal.  There is no mitral valve regurgitation.  The left ventricular ejection fraction is 45-50% by visual estimate.  A STENT PROMUS PREM MR 3.0X20 drug eluting stent was successfully placed, and does not overlap previously placed stent.  Dist RCA lesion, 100 %stenosed.  Post intervention, there is a 0% residual stenosis.  A STENT PROMUS PREM MR 3.5X28 drug eluting stent was successfully placed, and does not overlap previously placed stent.  Mid RCA lesion, 80 %stenosed.  Post intervention, there is a 0% residual stenosis.  A BALLOON [place] EMERGE MR 3.75X12 drug eluting stent was successfully placed, and does not overlap previously placed stent.  Ost RCA lesion, 70 %stenosed.  Post intervention, there is a 0% residual stenosis.    Acute inferior ST elevation MI secondary to total occlusion of the distal RCA. This culprit vessel is tortuous and additionally contained a high-grade mid vessel stenosis within a region of tortuosity and significant ostial  disease.  Successful recanalization of the culprit right coronary artery and implantation of 3 stents. A distal 3.0 x 20 Promus Premier DES reduced to 100% stenosis to 0%. Mid vessel 3.5 x 28 mm Promus Premier reduced the 80% stenosis to 0%. Ostial to proximal 3.5 x 12 mm Promus Premier reduced the ostial 70% stenosis to 0%. TIMI grade 3 flow was brisk post procedure with significant resolution of symptoms.  Widely patent left coronary system with nonobstructive disease present.  Inferobasal and mid inferior wall akinesis with EF 45-50%.  Failure to complete the procedure from the right radial approach due to radial artery spasm (unacceptable arm pain) and inability to control catheter movement (unable to obtain adequate guide support).    Echo 11/20/2015 LV EF: 55%  ------------------------------------------------------------------- Indications:      Chest pain 786.51.  ------------------------------------------------------------------- History:   PMH:   Dyspnea.  Coronary artery disease.  PMH: Myocardial infarction. Acute inferior ST elevation MI secondary to total occlusion of the distal RCA.  Risk factors:  Current tobacco use. Hypertension. Dyslipidemia.  ------------------------------------------------------------------- Study Conclusions  - Left ventricle: The cavity size was normal. Wall thickness was   normal. Basal inferoseptal hypokinesis, basal to mid inferior   hypokinesis. The estimated ejection fraction was 55%. Doppler   parameters are consistent with abnormal left ventricular   relaxation (grade 1 diastolic dysfunction). - Aortic valve: There was no stenosis. - Mitral valve: There was trivial regurgitation. - Right ventricle: The cavity size was normal. Systolic function   was normal. - Pulmonary arteries: No complete TR doppler jet so unable to   estimate PA systolic pressure. - Systemic veins: IVC  not visualized.  Impressions:  - Normal LV size with EF  55%. Wall motion abnormalities as noted   above. Normal RV size and systolic function. No significant   valvular abnormalities. _____________   History of Present Illness     55 year old female who developed chest discomfort at around 9:30 on the day of admission while drinking coffee. EMS was eventually summoned and transmitted EKG demonstrated inferior ST elevation. Upon arrival in the emergency room the patient was very anxious, complaining of severe chest discomfort, and dyspnea. No prior history of such chest discomfort. The discomfort has been unremitting since starting. She quantifies the pain is 10 over 10.  Hospital Course     She underwent urgent cardiac catheterization on 11/18/2015 which showed 40% left main lesion, 75% proximal left circumflex lesion, 70% mid left circumflex lesion, 100% distal RCA occlusion treated with 3.0 x 20 mm Promus DES, 80% mid RCA lesion treated with a separate 3.5 x 28 mm DES, 70% ostial RCA lesion treated with 3.75 x 12 mm DES, EF 45-50%. We were unable to complete the procedure from the right radial approach due to radial artery spasm, unacceptable arm pain, and inability to control catheter movement. Post cath, she was placed on aspirin and Brilinta. Patient was strongly advised to stop smoking. Troponin went up to 10.58 before trending back down. Lipid panel obtained on 11/18/2015 showed cholesterol 251, triglycerides 184, HDL 32, LDL 182. She has been placed on high-dose Lipitor.   Patient was seen in the morning of 11/20/2015 at which time patient was doing well without significant chest discomfort or shortness of breath. She has ambulated in the hallway without any discomfort. She is deemed stable for discharge from cardiology perspective. Echocardiogram obtained prior to discharge showed normal EF 55%, grade 1 diastolic dysfunction. She will need LFT and fasting lipid panel in 6 weeks. I have arranged seven-day transition of care follow-up in the office. We  have emphasized the importance of compliance with dual antiplatelet therapy and tobacco cessation. Her lisinopril was stopped during this admission as her blood pressure has been borderline. We will reassess on follow-up.   _____________  Discharge Vitals Blood pressure 123/68, pulse 81, temperature 98.6 F (37 C), temperature source Oral, resp. rate 20, height 5\' 2"  (1.575 m), weight 159 lb 13.3 oz (72.5 kg), SpO2 98 %.  Filed Weights   11/18/15 1340  Weight: 159 lb 13.3 oz (72.5 kg)    Labs & Radiologic Studies     CBC  Recent Labs  11/18/15 1403 11/19/15 0523  WBC 12.5* 10.8*  NEUTROABS 10.4*  --   HGB 12.3 12.3  HCT 37.2 38.1  MCV 92.8 94.5  PLT 277 262   Basic Metabolic Panel  Recent Labs  11/18/15 1403 11/19/15 0523  NA 134* 139  K 3.7 3.9  CL 106 106  CO2 18* 26  GLUCOSE 107* 109*  BUN 5* <5*  CREATININE 0.68 0.65  CALCIUM 8.1* 8.6*   Liver Function Tests  Recent Labs  11/18/15 1403 11/19/15 0523  AST 41 85*  ALT 18 37  ALKPHOS 61 70  BILITOT 0.6 1.0  PROT 5.9* 5.7*  ALBUMIN 3.4* 3.4*   Cardiac Enzymes  Recent Labs  11/19/15 1129 11/19/15 1754 11/20/15 0018  TROPONINI 8.18* 7.13* 5.25*   Fasting Lipid Panel  Recent Labs  11/18/15 1350  CHOL 251*  HDL 32*  LDLCALC 182*  TRIG 184*  CHOLHDL 7.8   Thyroid Function Tests  Recent Labs  11/18/15 1350  TSH 2.290    Dg Chest Port 1 View  Result Date: 11/19/2015 CLINICAL DATA:  Chest pain off and on this morning. EXAM: PORTABLE CHEST 1 VIEW COMPARISON:  01/30/2014 FINDINGS: Heart and mediastinal contours are within normal limits. No focal opacities or effusions. No acute bony abnormality. IMPRESSION: No active disease. Electronically Signed   By: Charlett Nose M.D.   On: 11/19/2015 11:06   Disposition   Pt is being discharged home today in good condition.  Follow-up Plans & Appointments    Follow-up Information    Laurann Montana, PA-C Follow up on 11/27/2015.   Specialties:   Cardiology, Radiology Why:  3:00PM.  Contact information: [address] [place] [address] [phone]          Discharge Instructions    Amb Referral to Cardiac Rehabilitation    Complete by:  As directed   Diagnosis:   STEMI PTCA Coronary Stents     Diet - low sodium heart healthy    Complete by:  As directed   Increase activity slowly    Complete by:  As directed      Discharge Medications   Current Discharge Medication List    START taking these medications   Details  aspirin 81 MG chewable tablet Chew 1 tablet (81 mg total) by mouth daily.    atorvastatin (LIPITOR) 80 MG tablet Take 1 tablet (80 mg total) by mouth daily at 6 PM. Qty: 90 tablet, Refills: 3    carvedilol (COREG) 3.125 MG tablet Take 1 tablet (3.125 mg total) by mouth 2 (two) times daily with a meal. Qty: 60 tablet, Refills: 11    ticagrelor (BRILINTA) 90 MG TABS tablet Take 1 tablet (90 mg total) by mouth 2 (two) times daily. Qty: 60 tablet, Refills: 11      CONTINUE these medications which have NOT CHANGED   Details  alum & mag hydroxide-simeth (MAALOX/MYLANTA) 200-200-20 MG/5ML suspension Take 30 mLs by mouth every 6 (six) hours as needed for indigestion or heartburn.    Melatonin 3 MG TABS Take 3 mg by mouth at bedtime.    nitroGLYCERIN (NITROSTAT) 0.4 MG SL tablet Place 1 tablet (0.4 mg total) under the tongue every 5 (five) minutes as needed for chest pain. Qty: 25 tablet, Refills: 3    simethicone (MYLICON) 80 MG chewable tablet Chew 80 mg by mouth every 6 (six) hours as needed for flatulence.    cyclobenzaprine (FLEXERIL) 10 MG tablet Take 10 mg by mouth 3 (three) times daily as needed.    LORazepam (ATIVAN) 1 MG tablet Take 1 tablet (1 mg total) by mouth every 6 (six) hours as needed for anxiety. Qty: 10 tablet, Refills: 0      STOP taking these medications     aspirin 81 MG tablet      ibuprofen (ADVIL,MOTRIN) 200 MG tablet      lisinopril (PRINIVIL,ZESTRIL) 10 MG tablet            Aspirin prescribed at discharge?  Yes High Intensity Statin Prescribed? (Lipitor 40-80mg  or Crestor 20-40mg ): Yes Beta Blocker Prescribed? Yes For EF 40% or less, Was ACEI/ARB Prescribed? No: EF normal ADP Receptor Inhibitor Prescribed? (i.e. Plavix etc.-Includes Medically Managed Patients): Yes For EF <40%, Aldosterone Inhibitor Prescribed? No: EF normal Was EF assessed during THIS hospitalization? Yes Was Cardiac Rehab II ordered? (Included Medically managed Patients): Yes   Outstanding Labs/Studies   None  Duration of Discharge Encounter   Greater than 30 minutes including physician time.  Signed, Azalee Course PA-C 11/20/2015, 3:50 PM

## 2015-11-20 NOTE — Telephone Encounter (Addendum)
Spoke with female at pt's home,he states he is going to pick pt up from hospital now,I gave him my number to call back and he had pt's apt date and time for f/u recorded

## 2015-11-20 NOTE — Progress Notes (Signed)
SUBJECTIVE:  No complaints except she wants caffienated coffee.  OBJECTIVE:   Vitals:   Vitals:   11/19/15 1404 11/19/15 1737 11/19/15 2037 11/20/15 0500  BP: 100/68 105/69 (!) 97/47 (!) 95/48  Pulse: 83 83 84 77  Resp: Temp: 98.7 F (37.1 C)  98.8 F (37.1 C) 98.2 F (36.8 C)  TempSrc: Oral  Oral   SpO2: 99%  99% 98%  Weight:      Height:       I&O's:   Intake/Output Summary (Last 24 hours) at 11/20/15 1610 Last data filed at 11/19/15 1405  Gross per 24 hour  Intake              420 ml  Output              550 ml  Net             -130 ml   TELEMETRY: Reviewed telemetry pt in NSR :     PHYSICAL EXAM General: Well developed, well nourished, in no acute distress Head: Eyes PERRLA, No xanthomas.   Normal cephalic and atramatic  Lungs:   Clear bilaterally to auscultation and percussion. Heart:   HRRR S1 S2 Pulses are 2+ & equal.            No carotid bruit. No JVD.  No abdominal bruits. No femoral bruits. Abdomen: Bowel sounds are positive, abdomen soft and non-tender without masses  Msk:  Back normal, normal gait. Normal strength and tone for age. Extremities:   No clubbing, cyanosis or edema.  DP +1 Neuro: Alert and oriented X 3. Psych:  Good affect, responds appropriately   LABS: Basic Metabolic Panel:  Recent Labs  96/04/54 1403 11/19/15 0523  NA 134* 139  K 3.7 3.9  CL 106 106  CO2 18* 26  GLUCOSE 107* 109*  BUN 5* <5*  CREATININE 0.68 0.65  CALCIUM 8.1* 8.6*   Liver Function Tests:  Recent Labs  11/18/15 1403 11/19/15 0523  AST 41 85*  ALT 18 37  ALKPHOS 61 70  BILITOT 0.6 1.0  PROT 5.9* 5.7*  ALBUMIN 3.4* 3.4*   No results for input(s): LIPASE, AMYLASE in the last 72 hours. CBC:  Recent Labs  11/18/15 1403 11/19/15 0523  WBC 12.5* 10.8*  NEUTROABS 10.4*  --   HGB 12.3 12.3  HCT 37.2 38.1  MCV 92.8 94.5  PLT 277 262   Cardiac Enzymes:  Recent Labs  11/19/15 1129 11/19/15 1754 11/20/15 0018  TROPONINI 8.18*  7.13* 5.25*   BNP: Invalid input(s): POCBNP D-Dimer: No results for input(s): DDIMER in the last 72 hours. Hemoglobin A1C: No results for input(s): HGBA1C in the last 72 hours. Fasting Lipid Panel:  Recent Labs  11/18/15 1350  CHOL 251*  HDL 32*  LDLCALC 182*  TRIG 184*  CHOLHDL 7.8   Thyroid Function Tests:  Recent Labs  11/18/15 1350  TSH 2.290   Anemia Panel: No results for input(s): VITAMINB12, FOLATE, FERRITIN, TIBC, IRON, RETICCTPCT in the last 72 hours. Coag Panel:   No results found for: INR, PROTIME  RADIOLOGY: Dg Chest Port 1 View  Result Date: 11/19/2015 CLINICAL DATA:  Chest pain off and on this morning. EXAM: PORTABLE CHEST 1 VIEW COMPARISON:  01/30/2014 FINDINGS: Heart and mediastinal contours are within normal limits. No focal opacities or effusions. No acute bony abnormality. IMPRESSION: No active disease. Electronically Signed   By: Charlett Nose M.D.   On: 11/19/2015 11:06  ASSESSMENT/PLAN:  1. Inferior STEMI     --s/p PCI/DES to RCA x 3 on 7/29    -- Continue DAPT with ASA/Brilinta    -- Continue statin/ACE I and BB.   2. CAD    --as described above 3. HTN - BP controlled and actually on the soft side.  Will continue on BB and stop ACE I.  Reassess BP as outpt and hopefully will be able to add on ACE I. 4. Tobacco use - cessation counseling per nursing 5. HL    --LDL 182.  Started on high dose statin.   6.  Ischemic DCM with EF 45-50%.  On BB.  ACE I held due to soft BP.    Patient has been ambulating in the hall without problems. Stable for discharge home today with early TOC followup in the office.  Will need FLP and ALT in 6 weeks.      Armanda Magic, MD  11/20/2015  8:12 AM

## 2015-11-20 NOTE — Telephone Encounter (Signed)
-----   Message from Dyane Dustman sent at 11/20/2015  8:47 AM EDT ----- Regarding: TCM Please call within 48 hrs.  Thanks, [employer]

## 2015-11-24 ENCOUNTER — Encounter: Payer: Self-pay | Admitting: Physician Assistant

## 2015-11-24 DIAGNOSIS — I252 Old myocardial infarction: Secondary | ICD-10-CM | POA: Insufficient documentation

## 2015-11-24 DIAGNOSIS — I2510 Atherosclerotic heart disease of native coronary artery without angina pectoris: Secondary | ICD-10-CM | POA: Insufficient documentation

## 2015-11-24 DIAGNOSIS — I501 Left ventricular failure: Secondary | ICD-10-CM | POA: Insufficient documentation

## 2015-11-24 NOTE — Progress Notes (Signed)
Cardiology Office Note    Date:  11/27/2015  ID:  Daegan Stroble, DOB [birth year], MRN [id number] PCP:  Josue Hector, MD  Cardiologist:  Dr. Wyline Mood   Chief Complaint: f/u STEMI  History of Present Illness:  Angela Roth is a 55 y.o. female with history of CAD (inferior STEMI 10/2015 s/p DESx3 to RCA), transient LV failure on LHC for STEMI 10/2015, HTN, HLD, GERD, fibromyalgia, tobacco abuse, chronic headaches, arthritis, hypothyroidism who presents for post-STEMI f/u. Recent adm 7/29-7/31/17 for interior STEMI secondary to total occlusion of dRCA also with high grade mid occlusion, s/p DES x3 (ostial, mid, distal), widely patent left system with nonobst disease, LV failure with EF 45-50%. Post cath she was started on standard post-MI therapy with ASA, Brilinta, Lipitor. Her lisinopril was stopped during this admission as her blood pressure was borderline. 2D Echo 11/20/15: EF 55%, grade 1 DD, +inf WMA, normal RV. Labs notable for LDL 182, CMET with minimally elevated AST likely 2/2 MI, troponin peak 10.  She returns for follow-up today with multiple questions. She continues to have intermittent shooting pain at her cath site. She has a small eraser-sized knot at her groin cath site. She has had daily right-sided chest pressure which is completely different than her prior angina. This has been present for 1 year. She describes it as a sensation that she can't quite take a deep breath. She feels it when she lays on her left side, emotional stress, or when she walks around and does activities. It improves with rest or calming herself down. It was unchanged after the cath. She says it feels like it does when her BP is elevated. The discomfort was a 4/10 initially in clinic and improved down to "barely noticeable" without any intervention. She says her pharmacy never received the RX for carvedilol so 2 days ago she went back to taking lisinopril instead when she started spiking her blood pressure  yesterday. She also says they did not receive atorvastatin. She reports compliance with ASA/Brilinta.   Past Medical History:  Diagnosis Date  . Arthritis   . CAD (coronary artery disease)    a. inferior STEMI 10/2015 s/p DESx3 to RCA.  Marland Kitchen Chronic headaches   . Colitis   . Depression   . Essential hypertension   . Fibromyalgia   . GERD (gastroesophageal reflux disease)   . History of ST elevation myocardial infarction (STEMI)   . Hyperlipidemia   . Hypothyroidism   . LVF (left ventricular failure) (HCC)    a. Transient LV failure during STEMI 10/2015 - EF 45-50% with f/u EF 55% by echo same adm.  . Tobacco abuse     Past Surgical History:  Procedure Laterality Date  . BACK SURGERY    . CARDIAC CATHETERIZATION N/A 11/18/2015   Procedure: Left Heart Cath and Coronary Angiography;  Surgeon: Lyn Records, MD;  [facility] CV LAB;  Service: Cardiovascular;  Laterality: N/A;  . CARDIAC CATHETERIZATION N/A 11/18/2015   Procedure: Coronary Stent Intervention;  Surgeon: Lyn Records, MD;  Location: [facility] INVASIVE CV LAB;  Service: Cardiovascular;  Laterality: N/A;  . CHOLECYSTECTOMY    . TOTAL ABDOMINAL HYSTERECTOMY W/ BILATERAL SALPINGOOPHORECTOMY      Current Medications: Current Outpatient Prescriptions  Medication Sig Dispense Refill  . alum & mag hydroxide-simeth (MAALOX/MYLANTA) 200-200-20 MG/5ML suspension Take 30 mLs by mouth every 6 (six) hours as needed for indigestion or heartburn.    Marland Kitchen aspirin 81 MG chewable tablet Chew 1 tablet (81 mg  total) by mouth daily.    Marland Kitchen atorvastatin (LIPITOR) 80 MG tablet Take 1 tablet (80 mg total) by mouth daily at 6 PM. 90 tablet 3  . cyclobenzaprine (FLEXERIL) 10 MG tablet Take 10 mg by mouth 3 (three) times daily as needed.    Marland Kitchen LORazepam (ATIVAN) 1 MG tablet Take 1 tablet (1 mg total) by mouth every 6 (six) hours as needed for anxiety. 10 tablet 0  . Melatonin 3 MG TABS Take 3 mg by mouth at bedtime.    . nitroGLYCERIN (NITROSTAT) 0.4  MG SL tablet Place 1 tablet (0.4 mg total) under the tongue every 5 (five) minutes as needed for chest pain. 25 tablet 3  . simethicone (MYLICON) 80 MG chewable tablet Chew 80 mg by mouth every 6 (six) hours as needed for flatulence.    . ticagrelor (BRILINTA) 90 MG TABS tablet Take 1 tablet (90 mg total) by mouth 2 (two) times daily. 60 tablet 11  . carvedilol (COREG) 3.125 MG tablet Take 1 tablet (3.125 mg total) by mouth 2 (two) times daily with a meal. (Patient not taking: Reported on 11/27/2015) 60 tablet 11   No current facility-administered medications for this visit.      Allergies:   Indomethacin   Social History   Social History  . Marital status: Divorced    Spouse name: N/A  . Number of children: N/A  . Years of education: N/A   Social History Main Topics  . Smoking status: Current Every Day Smoker    Packs/day: 1.00    Years: 42.00    Types: Cigarettes    Start date: 09/12/1971  . Smokeless tobacco: Never Used  . Alcohol use No  . Drug use: No  . Sexual activity: Not Asked   Other Topics Concern  . None   Social History Narrative  . None     Family History:  The patient's family history includes Asthma in her brother; Lung cancer in her maternal grandmother; Ovarian cancer in her maternal grandmother.   ROS:   Please see the history of present illness.  All other systems are reviewed and otherwise negative.    PHYSICAL EXAM:   VS:  BP 118/78   Pulse 95   Ht  (1.575 m)   Wt 155 lb (70.3 kg)   SpO2 99%   BMI 28.35 kg/m   BMI: Body mass index is 28.35 kg/m. GEN: Well nourished, well developed WF, in no acute distress  HEENT: normocephalic, atraumatic Neck: no JVD, carotid bruits, or masses Cardiac: RRR; no murmurs, rubs, or gallops, no edema  Respiratory:  clear to auscultation bilaterally, normal work of breathing GI: soft, nontender, nondistended, + BS MS: no deformity or atrophy  Skin: warm and dry, no rash, very small (fingertip sized)  hematoma at right groin site without ecchymosis or bruit. Right radial cath site without hematoma or ecchymosis; good pulse. Neuro:  Alert and Oriented x 3, Strength and sensation are intact, follows commands Psych: euthymic mood, full affect  Wt Readings from Last 3 Encounters:  11/27/15 155 lb (70.3 kg)  11/18/15 159 lb 13.3 oz (72.5 kg)  06/30/14 146 lb (66.2 kg)      Studies/Labs Reviewed:   EKG:  EKG was ordered today and personally reviewed by me and demonstrates NSR 80bpm, inferior TWI as well as V5-V6, appears similar to recent  Recent Labs: 11/18/2015: B Natriuretic Peptide 33.2; TSH 2.290 11/19/2015: ALT 37; BUN <5; Creatinine, Ser 0.65; Hemoglobin 12.3; Platelets 262; Potassium  3.9; Sodium 139   Lipid Panel    Component Value Date/Time   CHOL 251 (H) 11/18/2015 1350   TRIG 184 (H) 11/18/2015 1350   HDL 32 (L) 11/18/2015 1350   CHOLHDL 7.8 11/18/2015 1350   VLDL 37 11/18/2015 1350   LDLCALC 182 (H) 11/18/2015 1350    Additional studies/ records that were reviewed today include: Summarized above.    ASSESSMENT & PLAN:   1. CAD with STEMI as above - I discussed her chest pain in depth with Dr. Wyline Mood who also came to see the patient with me. The clinical picture is clouded by her fibromyalgia. The chest discomfort she has been experiencing lately has been present for 1 year, unchanged by recent cath. She is not tachypneic, tachycardic, or hypoxic. She has not had any recurrent angina-like symptoms (that was across her whole chest and into neck). Dr. Wyline Mood recommends continued watchful waiting. We will resume carvedilol to see if that helps. If it does not she may need further w/u of noncardiac chest pain by primary care. If she develops recurrent symptoms like prior angina, will need relook cath. Continue ASA, Brilinta.  She reports BP spikes yesterday which prompted her to resume lisinopril so I am hopeful she will tolerate both ACEI/BB on board together. If not, we can  cut lisinopril in half. Warning sx reviewed. 2. LV failure, transient - improved EF by cath. Continue ACEI. Resume low dose BB. 3. Essential HTN - follow BP at home with resumption of beta blocker. 4. Hyperlipidemia - f/u liver/lipids in 6 weeks. Her pharmacy indicates receipt of the atorvastatin rx but the patient did not pick it up. We notified her that it is waiting at her pharmacy 5. Tobacco abuse - counseled regarding cessation.  Disposition: F/u with Dr. Wyline Mood in 6-8 weeks. I also told her we have 24/7 answering service by phone if she ever encounters any med pickup issues like what she reported above.   Medication Adjustments/Labs and Tests Ordered: Current medicines are reviewed at length with the patient today.  Concerns regarding medicines are outlined above. Medication changes, Labs and Tests ordered today are summarized above and listed in the Patient Instructions accessible in Encounters.   Signed, Ronie Spies PA-C  11/27/2015 3:13 PM    [facility] [place] Location in [facility] [address] [place], [place] [address] Ph: [phone]; Fax [phone]

## 2015-11-27 ENCOUNTER — Encounter: Payer: Self-pay | Admitting: Physician Assistant

## 2015-11-27 ENCOUNTER — Ambulatory Visit (INDEPENDENT_AMBULATORY_CARE_PROVIDER_SITE_OTHER): Payer: Self-pay | Admitting: Physician Assistant

## 2015-11-27 VITALS — BP 118/78 | HR 95 | Ht 62.0 in | Wt 155.0 lb

## 2015-11-27 DIAGNOSIS — I252 Old myocardial infarction: Secondary | ICD-10-CM

## 2015-11-27 DIAGNOSIS — I10 Essential (primary) hypertension: Secondary | ICD-10-CM

## 2015-11-27 DIAGNOSIS — I501 Left ventricular failure: Secondary | ICD-10-CM

## 2015-11-27 DIAGNOSIS — R072 Precordial pain: Secondary | ICD-10-CM

## 2015-11-27 DIAGNOSIS — I2510 Atherosclerotic heart disease of native coronary artery without angina pectoris: Secondary | ICD-10-CM

## 2015-11-27 DIAGNOSIS — E785 Hyperlipidemia, unspecified: Secondary | ICD-10-CM

## 2015-11-27 DIAGNOSIS — Z79899 Other long term (current) drug therapy: Secondary | ICD-10-CM

## 2015-11-27 DIAGNOSIS — Z720 Tobacco use: Secondary | ICD-10-CM

## 2015-11-27 NOTE — Patient Instructions (Signed)
Medication Instructions:  Your physician recommends that you continue on your current medications as directed. Please refer to the Current Medication list given to you today.   Labwork: Your physician recommends that you return for lab work in: 8 WEEKS DO REPEAT LABS LFT'S  LIPID    Testing/Procedures: NONE  Follow-Up: Your physician recommends that you schedule a follow-up appointment in: 8 WEEKS WITH DR. BRANCH  PLEASE KEEP A LOG OF YOUR BLOOD PRESSURE AND CALL us AND LET us KNOW IF YOUR SYSTOLIC IS LESS THAN 100 ( THE TOP NUMBER )   Any Other Special Instructions Will Be Listed Below (If Applicable).     If you need a refill on your cardiac medications before your next appointment, please call your pharmacy.

## 2015-11-29 DIAGNOSIS — K219 Gastro-esophageal reflux disease without esophagitis: Secondary | ICD-10-CM | POA: Insufficient documentation

## 2015-12-19 ENCOUNTER — Telehealth: Payer: Self-pay | Admitting: Physician Assistant

## 2015-12-19 DIAGNOSIS — I10 Essential (primary) hypertension: Secondary | ICD-10-CM

## 2015-12-19 NOTE — Telephone Encounter (Signed)
Pt referred to Blima Singer for help with meds

## 2015-12-19 NOTE — Telephone Encounter (Signed)
Patient states she is about to run out of Brilinta. / tg

## 2016-01-24 ENCOUNTER — Encounter: Payer: Self-pay | Admitting: Cardiology

## 2016-01-24 ENCOUNTER — Ambulatory Visit (INDEPENDENT_AMBULATORY_CARE_PROVIDER_SITE_OTHER): Payer: Self-pay | Admitting: Cardiology

## 2016-01-24 VITALS — BP 140/88 | HR 92 | Ht 62.5 in | Wt 162.0 lb

## 2016-01-24 DIAGNOSIS — I2510 Atherosclerotic heart disease of native coronary artery without angina pectoris: Secondary | ICD-10-CM

## 2016-01-24 DIAGNOSIS — R0789 Other chest pain: Secondary | ICD-10-CM

## 2016-01-24 DIAGNOSIS — Z01818 Encounter for other preprocedural examination: Secondary | ICD-10-CM

## 2016-01-24 MED ORDER — PRAVASTATIN SODIUM 40 MG PO TABS: 40.0000 mg | ORAL_TABLET | Freq: Every evening | ORAL | 3 refills | Status: DC

## 2016-01-24 MED ORDER — NITROGLYCERIN 0.4 MG SL SUBL: 0.4000 mg | SUBLINGUAL_TABLET | SUBLINGUAL | 3 refills | Status: DC | PRN

## 2016-01-24 NOTE — Progress Notes (Signed)
Clinical Summary [name] is a 55 y.o.female seen today for follow up of the following medical problems.   1. CAD - admit 10/2015 with inferior STEMI, received DES to RCA x 3. Echo LVEF 55%. Cath results as reported below.  - ACE-I stopped during admission due to borderline bp - last visit she reported a chronic positional right sided chest pain. She has history of fibromyalgia and symptoms were similar to her previous pains. .   - Reports a new pain that has started since last visit.  - recent chest pains. Midchest, dull/pressure like pain can vary in severity but can be severe at times. Tends to occur with activity, but can happen rest. +SOB. Can have some dizziness. Not positional. Varies in duration, can be up to 30 minutes. Better with rest. Occurs daily. Different from prior fibromyalgia like pain.  - poor compliance with medications other than ASA and brillinta.       Past Medical History:  Diagnosis Date  . Arthritis   . CAD (coronary artery disease)    a. inferior STEMI 10/2015 s/p DESx3 to RCA.  Marland Kitchen. Chronic headaches   . Colitis   . Depression   . Essential hypertension   . Fibromyalgia   . GERD (gastroesophageal reflux disease)   . History of ST elevation myocardial infarction (STEMI)   . Hyperlipidemia   . Hypothyroidism   . LVF (left ventricular failure) (HCC)    a. Transient LV failure during STEMI 10/2015 - EF 45-50% with f/u EF 55% by echo same adm.  . Tobacco abuse      Allergies  Allergen Reactions  . Indomethacin Other (See Comments)    Patient has bad side effects "suicidal thoughts/homicidal thoughts"     Current Outpatient Prescriptions  Medication Sig Dispense Refill  . alum & mag hydroxide-simeth (MAALOX/MYLANTA) 200-200-20 MG/5ML suspension Take 30 mLs by mouth every 6 (six) hours as needed for indigestion or heartburn.    Marland Kitchen. aspirin 81 MG chewable tablet Chew 1 tablet (81 mg total) by mouth daily.    Marland Kitchen. atorvastatin (LIPITOR) 80 MG  tablet Take 1 tablet (80 mg total) by mouth daily at 6 PM. 90 tablet 3  . carvedilol (COREG) 3.125 MG tablet Take 1 tablet (3.125 mg total) by mouth 2 (two) times daily with a meal. (Patient not taking: Reported on 11/27/2015) 60 tablet 11  . cyclobenzaprine (FLEXERIL) 10 MG tablet Take 10 mg by mouth 3 (three) times daily as needed.    Marland Kitchen. LORazepam (ATIVAN) 1 MG tablet Take 1 tablet (1 mg total) by mouth every 6 (six) hours as needed for anxiety. 10 tablet 0  . Melatonin 3 MG TABS Take 3 mg by mouth at bedtime.    . nitroGLYCERIN (NITROSTAT) 0.4 MG SL tablet Place 1 tablet (0.4 mg total) under the tongue every 5 (five) minutes as needed for chest pain. 25 tablet 3  . simethicone (MYLICON) 80 MG chewable tablet Chew 80 mg by mouth every 6 (six) hours as needed for flatulence.    . ticagrelor (BRILINTA) 90 MG TABS tablet Take 1 tablet (90 mg total) by mouth 2 (two) times daily. 60 tablet 11   No current facility-administered medications for this visit.      Past Surgical History:  Procedure Laterality Date  . BACK SURGERY    . CARDIAC CATHETERIZATION N/A 11/18/2015   Procedure: Left Heart Cath and Coronary Angiography;  Surgeon: Lyn RecordsHenry W Smith, MD;  [facility];  Service: Cardiovascular;  Laterality: N/A;  . CARDIAC CATHETERIZATION N/A 11/18/2015   Procedure: Coronary Stent Intervention;  Surgeon: Lyn Records, MD;  Location: [facility] INVASIVE CV LAB;  Service: Cardiovascular;  Laterality: N/A;  . CHOLECYSTECTOMY    . TOTAL ABDOMINAL HYSTERECTOMY W/ BILATERAL SALPINGOOPHORECTOMY       Allergies  Allergen Reactions  . Indomethacin Other (See Comments)    Patient has bad side effects "suicidal thoughts/homicidal thoughts"      Family History  Problem Relation Age of Onset  . Lung cancer Maternal Grandmother   . Ovarian cancer Maternal Grandmother   . Asthma Brother      Social History [name] reports that she has been smoking Cigarettes.  She started smoking about 44  years ago. She has a 42.00 pack-year smoking history. She has never used smokeless tobacco. [name] reports that she does not drink alcohol.   Review of Systems CONSTITUTIONAL: No weight loss, fever, chills, weakness or fatigue.  HEENT: Eyes: No visual loss, blurred vision, double vision or yellow sclerae.No hearing loss, sneezing, congestion, runny nose or sore throat.  SKIN: No rash or itching.  CARDIOVASCULAR: per HPI RESPIRATORY: No shortness of breath, cough or sputum.  GASTROINTESTINAL: No anorexia, nausea, vomiting or diarrhea. No abdominal pain or blood.  GENITOURINARY: No burning on urination, no polyuria NEUROLOGICAL: No headache, dizziness, syncope, paralysis, ataxia, numbness or tingling in the extremities. No change in bowel or bladder control.  MUSCULOSKELETAL: No muscle, back pain, joint pain or stiffness.  LYMPHATICS: No enlarged nodes. No history of splenectomy.  PSYCHIATRIC: No history of depression or anxiety.  ENDOCRINOLOGIC: No reports of sweating, cold or heat intolerance. No polyuria or polydipsia.  Marland Kitchen   Physical Examination Vitals:   01/24/16 1141  BP: 140/88  Pulse: 92   Vitals:   01/24/16 1141  Weight: 162 lb (73.5 kg)  Height: 5' 2.5" (1.588 m)    Gen: resting comfortably, no acute distress HEENT: no scleral icterus, pupils equal round and reactive, no palptable cervical adenopathy,  CV: RRR, no m/r/g, no jvd Resp: Clear to auscultation bilaterally GI: abdomen is soft, non-tender, non-distended, normal bowel sounds, no hepatosplenomegaly MSK: extremities are warm, no edema.  Skin: warm, no rash Neuro:  no focal deficits Psych: appropriate affect   Diagnostic Studies  10/2015 echo Study Conclusions  - Left ventricle: The cavity size was normal. Wall thickness was   normal. Basal inferoseptal hypokinesis, basal to mid inferior   hypokinesis. The estimated ejection fraction was 55%. Doppler   parameters are consistent with abnormal left  ventricular   relaxation (grade 1 diastolic dysfunction). - Aortic valve: There was no stenosis. - Mitral valve: There was trivial regurgitation. - Right ventricle: The cavity size was normal. Systolic function   was normal. - Pulmonary arteries: No complete TR doppler jet so unable to   estimate PA systolic pressure. - Systemic veins: IVC not visualized.  Impressions:  - Normal LV size with EF 55%. Wall motion abnormalities as noted   above. Normal RV size and systolic function. No significant   valvular abnormalities.  10/2015 LHC  LM lesion, 40 %stenosed.  Prox Cx to Mid Cx lesion, 75 %stenosed.  Mid Cx to Dist Cx lesion, 70 %stenosed.  Prox LAD to Dist LAD lesion, 30 %stenosed.  The left ventricular systolic function is normal.  LV end diastolic pressure is normal.  There is no mitral valve regurgitation.  The left ventricular ejection fraction is 45-50% by visual estimate.  A STENT PROMUS  PREM MR 3.0X20 drug eluting stent was successfully placed, and does not overlap previously placed stent.  Dist RCA lesion, 100 %stenosed.  Post intervention, there is a 0% residual stenosis.  A STENT PROMUS PREM MR 3.5X28 drug eluting stent was successfully placed, and does not overlap previously placed stent.  Mid RCA lesion, 80 %stenosed.  Post intervention, there is a 0% residual stenosis.  A BALLOON [place] EMERGE MR 3.75X12 drug eluting stent was successfully placed, and does not overlap previously placed stent.  Ost RCA lesion, 70 %stenosed.  Post intervention, there is a 0% residual stenosis.    Acute inferior ST elevation MI secondary to total occlusion of the distal RCA. This culprit vessel is tortuous and additionally contained a high-grade mid vessel stenosis within a region of tortuosity and significant ostial disease.  Successful recanalization of the culprit right coronary artery and implantation of 3 stents. A distal 3.0 x 20 Promus Premier DES reduced to 100%  stenosis to 0%. Mid vessel 3.5 x 28 mm Promus Premier reduced the 80% stenosis to 0%. Ostial to proximal 3.5 x 12 mm Promus Premier reduced the ostial 70% stenosis to 0%. TIMI grade 3 flow was brisk post procedure with significant resolution of symptoms.  Widely patent left coronary system with nonobstructive disease present.  Inferobasal and mid inferior wall akinesis with EF 45-50%.  Failure to complete the procedure from the right radial approach due to radial artery spasm (unacceptable arm pain) and inability to control catheter movement (unable to obtain adequate guide support).  RECOMMENDATIONS:   Dual antiplatelet therapy 12 months and preferably longer  Aggressive risk factor modification including smoking cessation  Potential discharge in 48 hours depending upon clinical course   06/2014 GXT Patient exercised according to the Bruce protocol for 6 min 37 sec, achieving 8.90 METs. Resting heart rate increased from 88 bpm to 157 bpm (94% of THR) and resting bp increased from 125/92 up to 150/93. The test was stopped due to fatigue, the patient did not experience any chest pain. Baseline EKG showed NSR. Stress EKG showed no specific ischemic changes and no significant arrhythmias. O2 sats were 95-97% throughout the study.  Conclusions 1. Negative exercise stress test for ischemia 2. Duke treadmill score of 7 consistent with low risk 3. No evidence of oxygen desaturation during exercise 4. Very good exercise tolerance (110% of predicted based on age and gender)  Assessment and Plan   1. CAD - recent chest pain concerning for possible cardiac etiology - recent extensive stenting to RCA, she also had a bordeline LCX at time of last cath - we will arrange repeat cath to reevaluate her RCA s/p extensive intervention, may need consideration for FFR of LCX lesion reported 70-75% - compliant with ASA and brillinta, poor compliance with all other meds. Strongly encouarged better  compliance   I have reviewed the risks, indications, and alternatives to cardiac catheterization, possible angioplasty, and stenting with the patient. Risks include but are not limited to bleeding, infection, vascular injury, stroke, myocardial infection, arrhythmia, kidney injury, radiation-related injury in the case of prolonged fluoroscopy use, emergency cardiac surgery, and death. The patient understands the risks of serious complication is 1-2 in 1000 with diagnostic cardiac cath and 1-2% or less with angioplasty/stenting.     Antoine Poche, M.D.

## 2016-01-24 NOTE — Patient Instructions (Signed)
Medication Instructions:  Start PRAVASTATIN 40 MG - TAKE ONCE DAILY  NITRO - 1 TABLET UNDER THE TONGUE EVERY 5 MINUTES ( FOR UP TO THREE TIMES )   Labwork: Your physician recommends that you return for lab work in: TODAY  CBC BMET PT/INR   Testing/Procedures: Your physician has requested that you have a cardiac catheterization. Cardiac catheterization is used to diagnose and/or treat various heart conditions. Doctors may recommend this procedure for a number of different reasons. The most common reason is to evaluate chest pain. Chest pain can be a symptom of coronary artery disease (CAD), and cardiac catheterization can show whether plaque is narrowing or blocking your heart's arteries. This procedure is also used to evaluate the valves, as well as measure the blood flow and oxygen levels in different parts of your heart. For further information please visit [email or web address] Please follow instruction sheet, as given.    Follow-Up: Your physician recommends that you schedule a follow-up appointment in: 1 MONTH    Any Other Special Instructions Will Be Listed Below (If Applicable).     If you need a refill on your cardiac medications before your next appointment, please call your pharmacy.

## 2016-01-25 LAB — CBC WITH DIFFERENTIAL/PLATELET
BASOS ABS: 0 {cells}/uL (ref 0–200)
Basophils Relative: 0 %
EOS ABS: 555 {cells}/uL — AB (ref 15–500)
EOS PCT: 5 %
HCT: 41.7 % (ref 35.0–45.0)
Hemoglobin: 14.3 g/dL (ref 11.7–15.5)
LYMPHS PCT: 27 %
Lymphs Abs: 2997 cells/uL (ref 850–3900)
MCH: 31.8 pg (ref 27.0–33.0)
MCHC: 34.3 g/dL (ref 32.0–36.0)
MCV: 92.7 fL (ref 80.0–100.0)
MONOS PCT: 7 %
MPV: 8.9 fL (ref 7.5–12.5)
Monocytes Absolute: 777 cells/uL (ref 200–950)
NEUTROS ABS: 6771 {cells}/uL (ref 1500–7800)
NEUTROS PCT: 61 %
PLATELETS: 350 10*3/uL (ref 140–400)
RBC: 4.5 MIL/uL (ref 3.80–5.10)
RDW: 13.9 % (ref 11.0–15.0)
WBC: 11.1 10*3/uL — ABNORMAL HIGH (ref 3.8–10.8)

## 2016-01-25 LAB — BASIC METABOLIC PANEL
BUN: 10 mg/dL (ref 7–25)
CALCIUM: 9.6 mg/dL (ref 8.6–10.4)
CO2: 24 mmol/L (ref 20–31)
CREATININE: 0.77 mg/dL (ref 0.50–1.05)
Chloride: 101 mmol/L (ref 98–110)
Glucose, Bld: 91 mg/dL (ref 65–99)
Potassium: 4.3 mmol/L (ref 3.5–5.3)
Sodium: 137 mmol/L (ref 135–146)

## 2016-01-25 LAB — PROTIME-INR
INR: 1
PROTHROMBIN TIME: 10.4 s (ref 9.0–11.5)

## 2016-02-01 ENCOUNTER — Encounter (HOSPITAL_COMMUNITY)
Admission: RE | Disposition: A | Discharge status: Home / Self Care | Payer: Self-pay | Source: Ambulatory Visit | Attending: Interventional Cardiology

## 2016-02-01 ENCOUNTER — Inpatient Hospital Stay (HOSPITAL_COMMUNITY)
Admission: RE | Admit: 2016-02-01 | Discharge: 2016-02-02 | DRG: 287 | Disposition: A | Discharge status: Home / Self Care | Payer: Medicaid Other | Source: Ambulatory Visit | Attending: Interventional Cardiology | Admitting: Interventional Cardiology

## 2016-02-01 ENCOUNTER — Encounter (HOSPITAL_COMMUNITY): Payer: Self-pay | Admitting: *Deleted

## 2016-02-01 DIAGNOSIS — I110 Hypertensive heart disease with heart failure: Secondary | ICD-10-CM | POA: Diagnosis present

## 2016-02-01 DIAGNOSIS — Z955 Presence of coronary angioplasty implant and graft: Secondary | ICD-10-CM

## 2016-02-01 DIAGNOSIS — I501 Left ventricular failure: Secondary | ICD-10-CM | POA: Diagnosis present

## 2016-02-01 DIAGNOSIS — Z825 Family history of asthma and other chronic lower respiratory diseases: Secondary | ICD-10-CM

## 2016-02-01 DIAGNOSIS — Z7902 Long term (current) use of antithrombotics/antiplatelets: Secondary | ICD-10-CM | POA: Diagnosis not present

## 2016-02-01 DIAGNOSIS — K219 Gastro-esophageal reflux disease without esophagitis: Secondary | ICD-10-CM | POA: Diagnosis present

## 2016-02-01 DIAGNOSIS — I10 Essential (primary) hypertension: Secondary | ICD-10-CM | POA: Diagnosis present

## 2016-02-01 DIAGNOSIS — I4891 Unspecified atrial fibrillation: Secondary | ICD-10-CM | POA: Diagnosis present

## 2016-02-01 DIAGNOSIS — E7800 Pure hypercholesterolemia, unspecified: Secondary | ICD-10-CM | POA: Diagnosis present

## 2016-02-01 DIAGNOSIS — I252 Old myocardial infarction: Secondary | ICD-10-CM

## 2016-02-01 DIAGNOSIS — F419 Anxiety disorder, unspecified: Secondary | ICD-10-CM | POA: Diagnosis present

## 2016-02-01 DIAGNOSIS — M797 Fibromyalgia: Secondary | ICD-10-CM | POA: Diagnosis present

## 2016-02-01 DIAGNOSIS — Z7982 Long term (current) use of aspirin: Secondary | ICD-10-CM | POA: Diagnosis not present

## 2016-02-01 DIAGNOSIS — F17210 Nicotine dependence, cigarettes, uncomplicated: Secondary | ICD-10-CM | POA: Diagnosis present

## 2016-02-01 DIAGNOSIS — Z79899 Other long term (current) drug therapy: Secondary | ICD-10-CM | POA: Diagnosis not present

## 2016-02-01 DIAGNOSIS — R079 Chest pain, unspecified: Secondary | ICD-10-CM

## 2016-02-01 DIAGNOSIS — I2510 Atherosclerotic heart disease of native coronary artery without angina pectoris: Secondary | ICD-10-CM | POA: Diagnosis present

## 2016-02-01 DIAGNOSIS — E785 Hyperlipidemia, unspecified: Secondary | ICD-10-CM | POA: Diagnosis present

## 2016-02-01 DIAGNOSIS — E039 Hypothyroidism, unspecified: Secondary | ICD-10-CM | POA: Diagnosis present

## 2016-02-01 DIAGNOSIS — Z720 Tobacco use: Secondary | ICD-10-CM | POA: Diagnosis present

## 2016-02-01 HISTORY — PX: CARDIAC CATHETERIZATION: SHX172

## 2016-02-01 LAB — TROPONIN I
Troponin I: <0.03 ng/mL (ref <0.03)
Troponin I: <0.03 ng/mL (ref <0.03)

## 2016-02-01 LAB — COMPREHENSIVE METABOLIC PANEL
ALBUMIN: 3.4 g/dL — AB (ref 3.5–5.0)
ALK PHOS: 56 U/L (ref 38–126)
ALT: 17 U/L (ref 14–54)
AST: 19 U/L (ref 15–41)
Anion gap: 8 (ref 5–15)
BUN: 6 mg/dL (ref 6–20)
CALCIUM: 8.1 mg/dL — AB (ref 8.9–10.3)
CO2: 20 mmol/L — AB (ref 22–32)
CREATININE: 0.69 mg/dL (ref 0.44–1.00)
Chloride: 109 mmol/L (ref 101–111)
GFR calc Af Amer: >60 mL/min (ref >60)
GFR calc non Af Amer: >60 mL/min (ref >60)
GLUCOSE: 96 mg/dL (ref 65–99)
Potassium: 3.5 mmol/L (ref 3.5–5.1)
SODIUM: 137 mmol/L (ref 135–145)
Total Bilirubin: 0.4 mg/dL (ref 0.3–1.2)
Total Protein: 5.9 g/dL — ABNORMAL LOW (ref 6.5–8.1)

## 2016-02-01 LAB — MRSA PCR SCREENING: MRSA BY PCR: NEGATIVE

## 2016-02-01 LAB — BRAIN NATRIURETIC PEPTIDE: B Natriuretic Peptide: 54.7 pg/mL (ref 0.0–100.0)

## 2016-02-01 LAB — POCT ACTIVATED CLOTTING TIME
ACTIVATED CLOTTING TIME: 208 s
ACTIVATED CLOTTING TIME: 521 s
ACTIVATED CLOTTING TIME: 197 s
ACTIVATED CLOTTING TIME: 142 s
Activated Clotting Time: 235 seconds

## 2016-02-01 LAB — TSH: TSH: 4.193 u[IU]/mL (ref 0.350–4.500)

## 2016-02-01 SURGERY — LEFT HEART CATH AND CORONARY ANGIOGRAPHY
Anesthesia: LOCAL

## 2016-02-01 MED ORDER — SODIUM CHLORIDE 0.9 % WEIGHT BASED INFUSION
3.0000 mL/kg/h | INTRAVENOUS | Status: DC
  Administered 2016-02-01: 3 mL/kg/h via INTRAVENOUS

## 2016-02-01 MED ORDER — NITROGLYCERIN 1 MG/10 ML FOR IR/CATH LAB
INTRA_ARTERIAL | Status: DC | PRN
  Administered 2016-02-01 (×2): 200 ug via INTRACORONARY

## 2016-02-01 MED ORDER — TICAGRELOR 90 MG PO TABS
ORAL_TABLET | ORAL | Status: DC | PRN
  Administered 2016-02-01: 90 mg via ORAL

## 2016-02-01 MED ORDER — LIDOCAINE HCL (PF) 1 % IJ SOLN
INTRAMUSCULAR | Status: AC
  Filled 2016-02-01: qty 30

## 2016-02-01 MED ORDER — NITROGLYCERIN 1 MG/10 ML FOR IR/CATH LAB
INTRA_ARTERIAL | Status: AC
  Filled 2016-02-01: qty 10

## 2016-02-01 MED ORDER — LORAZEPAM 1 MG PO TABS: 1.0000 mg | ORAL_TABLET | Freq: Four times a day (QID) | ORAL | Status: DC | PRN

## 2016-02-01 MED ORDER — SODIUM CHLORIDE 0.9% FLUSH
3.0000 mL | Freq: Two times a day (BID) | INTRAVENOUS | Status: DC
  Administered 2016-02-01 – 2016-02-02 (×2): 3 mL via INTRAVENOUS

## 2016-02-01 MED ORDER — NITROGLYCERIN 0.4 MG SL SUBL: 0.4000 mg | SUBLINGUAL_TABLET | SUBLINGUAL | Status: DC | PRN

## 2016-02-01 MED ORDER — NITROGLYCERIN IN D5W 200-5 MCG/ML-% IV SOLN: 10.0000 ug/min | INTRAVENOUS | Status: DC

## 2016-02-01 MED ORDER — TICAGRELOR 90 MG PO TABS
ORAL_TABLET | ORAL | Status: AC
  Filled 2016-02-01: qty 1

## 2016-02-01 MED ORDER — ACETAMINOPHEN 325 MG PO TABS
ORAL_TABLET | ORAL | Status: AC
  Filled 2016-02-01: qty 2

## 2016-02-01 MED ORDER — FENTANYL CITRATE (PF) 100 MCG/2ML IJ SOLN
INTRAMUSCULAR | Status: AC
  Filled 2016-02-01: qty 2

## 2016-02-01 MED ORDER — ATORVASTATIN CALCIUM 80 MG PO TABS
80.0000 mg | ORAL_TABLET | Freq: Every day | ORAL | Status: DC
  Administered 2016-02-01: 80 mg via ORAL
  Filled 2016-02-01: qty 1

## 2016-02-01 MED ORDER — IOPAMIDOL (ISOVUE-370) INJECTION 76%
INTRAVENOUS | Status: AC
  Filled 2016-02-01: qty 100

## 2016-02-01 MED ORDER — CYCLOBENZAPRINE HCL 10 MG PO TABS
10.0000 mg | ORAL_TABLET | Freq: Three times a day (TID) | ORAL | Status: DC | PRN
  Administered 2016-02-01: 10 mg via ORAL
  Filled 2016-02-01: qty 1

## 2016-02-01 MED ORDER — AMIODARONE HCL 150 MG/3ML IV SOLN
INTRAVENOUS | Status: AC
  Filled 2016-02-01: qty 6

## 2016-02-01 MED ORDER — HEPARIN SODIUM (PORCINE) 1000 UNIT/ML IJ SOLN
INTRAMUSCULAR | Status: DC | PRN
  Administered 2016-02-01: 4000 [IU] via INTRAVENOUS
  Administered 2016-02-01: 7000 [IU] via INTRAVENOUS

## 2016-02-01 MED ORDER — NITROGLYCERIN IN D5W 200-5 MCG/ML-% IV SOLN
INTRAVENOUS | Status: AC
  Filled 2016-02-01: qty 250

## 2016-02-01 MED ORDER — SIMETHICONE 80 MG PO CHEW: 80.0000 mg | CHEWABLE_TABLET | Freq: Four times a day (QID) | ORAL | Status: DC | PRN

## 2016-02-01 MED ORDER — NITROGLYCERIN IN D5W 200-5 MCG/ML-% IV SOLN
INTRAVENOUS | Status: DC | PRN
  Administered 2016-02-01: 10 ug/min via INTRAVENOUS

## 2016-02-01 MED ORDER — MELATONIN 3 MG PO TABS
3.0000 mg | ORAL_TABLET | Freq: Every evening | ORAL | Status: DC | PRN
  Administered 2016-02-01: 3 mg via ORAL
  Filled 2016-02-01 (×2): qty 1

## 2016-02-01 MED ORDER — MIDAZOLAM HCL 2 MG/2ML IJ SOLN
INTRAMUSCULAR | Status: AC
  Filled 2016-02-01: qty 2

## 2016-02-01 MED ORDER — ASPIRIN 81 MG PO CHEW
81.0000 mg | CHEWABLE_TABLET | ORAL | Status: AC
  Administered 2016-02-01: 81 mg via ORAL

## 2016-02-01 MED ORDER — HEPARIN (PORCINE) IN NACL 2-0.9 UNIT/ML-% IJ SOLN
INTRAMUSCULAR | Status: AC
  Filled 2016-02-01: qty 1000

## 2016-02-01 MED ORDER — ADENOSINE (DIAGNOSTIC) 140MCG/KG/MIN
INTRAVENOUS | Status: DC | PRN
  Administered 2016-02-01: 140 ug/kg/min via INTRAVENOUS

## 2016-02-01 MED ORDER — HEPARIN (PORCINE) IN NACL 2-0.9 UNIT/ML-% IJ SOLN
INTRAMUSCULAR | Status: DC | PRN
  Administered 2016-02-01: 1000 mL

## 2016-02-01 MED ORDER — SODIUM CHLORIDE 0.9 % WEIGHT BASED INFUSION: 1.0000 mL/kg/h | INTRAVENOUS | Status: DC

## 2016-02-01 MED ORDER — LIDOCAINE HCL (PF) 1 % IJ SOLN
INTRAMUSCULAR | Status: DC | PRN
  Administered 2016-02-01: 13 mL

## 2016-02-01 MED ORDER — AMIODARONE HCL IN DEXTROSE 360-4.14 MG/200ML-% IV SOLN
INTRAVENOUS | Status: AC
  Filled 2016-02-01: qty 200

## 2016-02-01 MED ORDER — FENTANYL CITRATE (PF) 100 MCG/2ML IJ SOLN
INTRAMUSCULAR | Status: DC | PRN
  Administered 2016-02-01: 50 ug via INTRAVENOUS
  Administered 2016-02-01: 25 ug via INTRAVENOUS

## 2016-02-01 MED ORDER — SODIUM CHLORIDE 0.9% FLUSH: 3.0000 mL | INTRAVENOUS | Status: DC | PRN

## 2016-02-01 MED ORDER — AMIODARONE HCL IN DEXTROSE 360-4.14 MG/200ML-% IV SOLN
60.0000 mg/h | INTRAVENOUS | Status: AC
  Administered 2016-02-01: 60 mg/h via INTRAVENOUS
  Filled 2016-02-01 (×2): qty 200

## 2016-02-01 MED ORDER — CARVEDILOL 3.125 MG PO TABS
3.1250 mg | ORAL_TABLET | Freq: Two times a day (BID) | ORAL | Status: DC
  Administered 2016-02-01: 3.125 mg via ORAL
  Filled 2016-02-01: qty 1

## 2016-02-01 MED ORDER — AMIODARONE HCL IN DEXTROSE 360-4.14 MG/200ML-% IV SOLN
30.0000 mg/h | INTRAVENOUS | Status: DC
  Administered 2016-02-02: 30 mg/h via INTRAVENOUS
  Filled 2016-02-01: qty 200

## 2016-02-01 MED ORDER — ONDANSETRON HCL 4 MG/2ML IJ SOLN: 4.0000 mg | Freq: Four times a day (QID) | INTRAMUSCULAR | Status: DC | PRN

## 2016-02-01 MED ORDER — SODIUM CHLORIDE 0.9% FLUSH: 3.0000 mL | Freq: Two times a day (BID) | INTRAVENOUS | Status: DC

## 2016-02-01 MED ORDER — ADENOSINE 12 MG/4ML IV SOLN
INTRAVENOUS | Status: AC
  Filled 2016-02-01: qty 16

## 2016-02-01 MED ORDER — HEPARIN SODIUM (PORCINE) 1000 UNIT/ML IJ SOLN
INTRAMUSCULAR | Status: AC
  Filled 2016-02-01: qty 1

## 2016-02-01 MED ORDER — ACETAMINOPHEN 325 MG PO TABS
650.0000 mg | ORAL_TABLET | ORAL | Status: DC | PRN
  Administered 2016-02-01 – 2016-02-02 (×4): 650 mg via ORAL
  Filled 2016-02-01 (×3): qty 2

## 2016-02-01 MED ORDER — OXYCODONE-ACETAMINOPHEN 5-325 MG PO TABS
1.0000 | ORAL_TABLET | ORAL | Status: DC | PRN
  Administered 2016-02-01 (×3): 1 via ORAL
  Filled 2016-02-01: qty 1

## 2016-02-01 MED ORDER — TICAGRELOR 90 MG PO TABS
90.0000 mg | ORAL_TABLET | Freq: Two times a day (BID) | ORAL | Status: DC
  Administered 2016-02-01 – 2016-02-02 (×2): 90 mg via ORAL
  Filled 2016-02-01 (×2): qty 1

## 2016-02-01 MED ORDER — IOPAMIDOL (ISOVUE-370) INJECTION 76%
INTRAVENOUS | Status: DC | PRN
  Administered 2016-02-01: 170 mL via INTRA_ARTERIAL

## 2016-02-01 MED ORDER — ASPIRIN 81 MG PO CHEW
81.0000 mg | CHEWABLE_TABLET | Freq: Every day | ORAL | Status: DC
  Administered 2016-02-02: 81 mg via ORAL
  Filled 2016-02-01: qty 1

## 2016-02-01 MED ORDER — MIDAZOLAM HCL 2 MG/2ML IJ SOLN
INTRAMUSCULAR | Status: DC | PRN
  Administered 2016-02-01 (×3): 1 mg via INTRAVENOUS

## 2016-02-01 MED ORDER — AMIODARONE HCL IN DEXTROSE 360-4.14 MG/200ML-% IV SOLN
INTRAVENOUS | Status: DC | PRN
  Administered 2016-02-01: 60 mg/h via INTRAVENOUS

## 2016-02-01 MED ORDER — HEPARIN (PORCINE) IN NACL 100-0.45 UNIT/ML-% IJ SOLN: 1100.0000 [IU]/h | INTRAMUSCULAR | Status: DC

## 2016-02-01 MED ORDER — OXYCODONE-ACETAMINOPHEN 5-325 MG PO TABS
ORAL_TABLET | ORAL | Status: AC
  Filled 2016-02-01: qty 1

## 2016-02-01 MED ORDER — ONDANSETRON HCL 4 MG/2ML IJ SOLN
4.0000 mg | Freq: Four times a day (QID) | INTRAMUSCULAR | Status: DC | PRN
  Administered 2016-02-01: 4 mg via INTRAVENOUS
  Filled 2016-02-01: qty 2

## 2016-02-01 MED ORDER — SODIUM CHLORIDE 0.9 % IV SOLN: 250.0000 mL | INTRAVENOUS | Status: DC | PRN

## 2016-02-01 MED ORDER — IOPAMIDOL (ISOVUE-370) INJECTION 76%
INTRAVENOUS | Status: AC
  Filled 2016-02-01: qty 50

## 2016-02-01 MED ORDER — ASPIRIN 81 MG PO CHEW
CHEWABLE_TABLET | ORAL | Status: AC
  Filled 2016-02-01: qty 1

## 2016-02-01 MED ORDER — AMIODARONE HCL 150 MG/3ML IV SOLN
INTRAVENOUS | Status: DC | PRN
  Administered 2016-02-01 (×2): 150 mg via INTRAVENOUS

## 2016-02-01 MED ORDER — SODIUM CHLORIDE 0.9 % WEIGHT BASED INFUSION: 1.0000 mL/kg/h | INTRAVENOUS | Status: AC

## 2016-02-01 SURGICAL SUPPLY — 15 items
CATH INFINITI 5FR JL4 (CATHETERS) ×1 IMPLANT
CATH INFINITI JR4 5F (CATHETERS) ×1 IMPLANT
CATH MICROCATH NAVVUS (MICROCATHETER) IMPLANT
CATH SITESEER 5F MULTI A 2 (CATHETERS) ×1 IMPLANT
GUIDE CATH RUNWAY 6FR CLS3 (CATHETERS) ×1 IMPLANT
KIT ESSENTIALS PG (KITS) ×1 IMPLANT
KIT HEART LEFT (KITS) ×2 IMPLANT
MICROCATHETER NAVVUS (MICROCATHETER) ×2
PACK CARDIAC CATHETERIZATION (CUSTOM PROCEDURE TRAY) ×2 IMPLANT
SHEATH PINNACLE 5F 10CM (SHEATH) ×1 IMPLANT
SHEATH PINNACLE 6F 10CM (SHEATH) ×1 IMPLANT
TRANSDUCER W/STOPCOCK (MISCELLANEOUS) ×2 IMPLANT
TUBING CIL FLEX 10 FLL-RA (TUBING) ×3 IMPLANT
WIRE ASAHI PROWATER 180CM (WIRE) ×1 IMPLANT
WIRE EMERALD 3MM-J .035X150CM (WIRE) ×1 IMPLANT

## 2016-02-01 NOTE — Progress Notes (Signed)
Report and care transferred to [facility]. Patient resting in bed comfortably at this time. Rt groin soft with no active bleeding or hematoma noted.

## 2016-02-01 NOTE — Progress Notes (Signed)
Act 142. 21fr sheath removed rfa and direct pressure maintained for 20 minutes resulting in complete hemostasis.  Pt teaching done with voice back.  Dsg applied to site.  Distal pulses palpatable Rt DP and PT.

## 2016-02-01 NOTE — Progress Notes (Addendum)
Patient had orders to start heparin at 0000. Per cath report from Dr. Katrinka Blazing, heparin only to be started if patient had not converted back to normal sinus. Patient normal sinus since 3pm. Dr. Donnie Aho paged and confirmed heparin should not be started. Pharmacy notified and orders removed. Will continue to monitor.

## 2016-02-01 NOTE — Progress Notes (Signed)
Pt converted from Afib to NSR at 1457.

## 2016-02-01 NOTE — Progress Notes (Signed)
Patient arrived from Cath lab. Report received from [facility]. Patient resting comfortably at this time. 46F Sheath in Rt Femoral Artery. Site WNL. DP/PT pulses by doppler. Will continue to monitor patient.

## 2016-02-01 NOTE — CV Procedure (Signed)
   Difficult procedure performed from the right femoral. Prior acute STEMI performed from the right radial approach was associated with significant spasm so the alternate access route was chosen.  Widely patent ostial mid and distal RCA stents placed during the acute infarction. 70-80% mid circumflex eccentric stenosis and diffuse 80% distal circumflex obtuse marginal stenosis. FFR across the mid circumflex 0.93. FFR was not measured distally due to small vessel caliber.  Normal overall LV function with EF 55%.  Atrial fibrillation with rapid ventricular response developing during the procedure, associated with chest discomfort, and dyspnea. IV amiodarone is given, 300 mg total and IV amiodarone drip is started.  IV nitroglycerin and IV heparin will be used until atrial fibrillation resolves.  Stepdown status. Hopeful discharge in a.m.

## 2016-02-01 NOTE — Progress Notes (Signed)
ACT 235. Rt groin soft with no active bleeding or hematoma noted. Will continue to monitor patient

## 2016-02-01 NOTE — H&P (View-Only) (Signed)
   Clinical Summary Ms. Kotowski is a 55 y.o.female seen today for follow up of the following medical problems.   1. CAD - admit 10/2015 with inferior STEMI, received DES to RCA x 3. Echo LVEF 55%. Cath results as reported below.  - ACE-I stopped during admission due to borderline bp - last visit she reported a chronic positional right sided chest pain. She has history of fibromyalgia and symptoms were similar to her previous pains. .   - Reports a new pain that has started since last visit.  - recent chest pains. Midchest, dull/pressure like pain can vary in severity but can be severe at times. Tends to occur with activity, but can happen rest. +SOB. Can have some dizziness. Not positional. Varies in duration, can be up to 30 minutes. Better with rest. Occurs daily. Different from prior fibromyalgia like pain.  - poor compliance with medications other than ASA and brillinta.       Past Medical History:  Diagnosis Date  . Arthritis   . CAD (coronary artery disease)    a. inferior STEMI 10/2015 s/p DESx3 to RCA.  . Chronic headaches   . Colitis   . Depression   . Essential hypertension   . Fibromyalgia   . GERD (gastroesophageal reflux disease)   . History of ST elevation myocardial infarction (STEMI)   . Hyperlipidemia   . Hypothyroidism   . LVF (left ventricular failure) (HCC)    a. Transient LV failure during STEMI 10/2015 - EF 45-50% with f/u EF 55% by echo same adm.  . Tobacco abuse      Allergies  Allergen Reactions  . Indomethacin Other (See Comments)    Patient has bad side effects "suicidal thoughts/homicidal thoughts"     Current Outpatient Prescriptions  Medication Sig Dispense Refill  . alum & mag hydroxide-simeth (MAALOX/MYLANTA) 200-200-20 MG/5ML suspension Take 30 mLs by mouth every 6 (six) hours as needed for indigestion or heartburn.    . aspirin 81 MG chewable tablet Chew 1 tablet (81 mg total) by mouth daily.    . atorvastatin (LIPITOR) 80 MG  tablet Take 1 tablet (80 mg total) by mouth daily at 6 PM. 90 tablet 3  . carvedilol (COREG) 3.125 MG tablet Take 1 tablet (3.125 mg total) by mouth 2 (two) times daily with a meal. (Patient not taking: Reported on 11/27/2015) 60 tablet 11  . cyclobenzaprine (FLEXERIL) 10 MG tablet Take 10 mg by mouth 3 (three) times daily as needed.    . LORazepam (ATIVAN) 1 MG tablet Take 1 tablet (1 mg total) by mouth every 6 (six) hours as needed for anxiety. 10 tablet 0  . Melatonin 3 MG TABS Take 3 mg by mouth at bedtime.    . nitroGLYCERIN (NITROSTAT) 0.4 MG SL tablet Place 1 tablet (0.4 mg total) under the tongue every 5 (five) minutes as needed for chest pain. 25 tablet 3  . simethicone (MYLICON) 80 MG chewable tablet Chew 80 mg by mouth every 6 (six) hours as needed for flatulence.    . ticagrelor (BRILINTA) 90 MG TABS tablet Take 1 tablet (90 mg total) by mouth 2 (two) times daily. 60 tablet 11   No current facility-administered medications for this visit.      Past Surgical History:  Procedure Laterality Date  . BACK SURGERY    . CARDIAC CATHETERIZATION N/A 11/18/2015   Procedure: Left Heart Cath and Coronary Angiography;  Surgeon: Henry W Smith, MD;  Location: [facility];    Service: Cardiovascular;  Laterality: N/A;  . CARDIAC CATHETERIZATION N/A 11/18/2015   Procedure: Coronary Stent Intervention;  Surgeon: Henry W Smith, MD;  Location: [facility];  Service: Cardiovascular;  Laterality: N/A;  . CHOLECYSTECTOMY    . TOTAL ABDOMINAL HYSTERECTOMY W/ BILATERAL SALPINGOOPHORECTOMY       Allergies  Allergen Reactions  . Indomethacin Other (See Comments)    Patient has bad side effects "suicidal thoughts/homicidal thoughts"      Family History  Problem Relation Age of Onset  . Lung cancer Maternal Grandmother   . Ovarian cancer Maternal Grandmother   . Asthma Brother      Social History Ms. Rotenberry reports that she has been smoking Cigarettes.  She started smoking about 44  years ago. She has a 42.00 pack-year smoking history. She has never used smokeless tobacco. Ms. Hlavacek reports that she does not drink alcohol.   Review of Systems CONSTITUTIONAL: No weight loss, fever, chills, weakness or fatigue.  HEENT: Eyes: No visual loss, blurred vision, double vision or yellow sclerae.No hearing loss, sneezing, congestion, runny nose or sore throat.  SKIN: No rash or itching.  CARDIOVASCULAR: per HPI RESPIRATORY: No shortness of breath, cough or sputum.  GASTROINTESTINAL: No anorexia, nausea, vomiting or diarrhea. No abdominal pain or blood.  GENITOURINARY: No burning on urination, no polyuria NEUROLOGICAL: No headache, dizziness, syncope, paralysis, ataxia, numbness or tingling in the extremities. No change in bowel or bladder control.  MUSCULOSKELETAL: No muscle, back pain, joint pain or stiffness.  LYMPHATICS: No enlarged nodes. No history of splenectomy.  PSYCHIATRIC: No history of depression or anxiety.  ENDOCRINOLOGIC: No reports of sweating, cold or heat intolerance. No polyuria or polydipsia.  .   Physical Examination Vitals:   01/24/16 1141  BP: 140/88  Pulse: 92   Vitals:   01/24/16 1141  Weight: 162 lb (73.5 kg)  Height: 5' 2.5" (1.588 m)    Gen: resting comfortably, no acute distress HEENT: no scleral icterus, pupils equal round and reactive, no palptable cervical adenopathy,  CV: RRR, no m/r/g, no jvd Resp: Clear to auscultation bilaterally GI: abdomen is soft, non-tender, non-distended, normal bowel sounds, no hepatosplenomegaly MSK: extremities are warm, no edema.  Skin: warm, no rash Neuro:  no focal deficits Psych: appropriate affect   Diagnostic Studies  10/2015 echo Study Conclusions  - Left ventricle: The cavity size was normal. Wall thickness was   normal. Basal inferoseptal hypokinesis, basal to mid inferior   hypokinesis. The estimated ejection fraction was 55%. Doppler   parameters are consistent with abnormal left  ventricular   relaxation (grade 1 diastolic dysfunction). - Aortic valve: There was no stenosis. - Mitral valve: There was trivial regurgitation. - Right ventricle: The cavity size was normal. Systolic function   was normal. - Pulmonary arteries: No complete TR doppler jet so unable to   estimate PA systolic pressure. - Systemic veins: IVC not visualized.  Impressions:  - Normal LV size with EF 55%. Wall motion abnormalities as noted   above. Normal RV size and systolic function. No significant   valvular abnormalities.  10/2015 LHC  LM lesion, 40 %stenosed.  Prox Cx to Mid Cx lesion, 75 %stenosed.  Mid Cx to Dist Cx lesion, 70 %stenosed.  Prox LAD to Dist LAD lesion, 30 %stenosed.  The left ventricular systolic function is normal.  LV end diastolic pressure is normal.  There is no mitral valve regurgitation.  The left ventricular ejection fraction is 45-50% by visual estimate.  A STENT PROMUS   PREM MR 3.0X20 drug eluting stent was successfully placed, and does not overlap previously placed stent.  Dist RCA lesion, 100 %stenosed.  Post intervention, there is a 0% residual stenosis.  A STENT PROMUS PREM MR 3.5X28 drug eluting stent was successfully placed, and does not overlap previously placed stent.  Mid RCA lesion, 80 %stenosed.  Post intervention, there is a 0% residual stenosis.  A BALLOON [place] EMERGE MR 3.75X12 drug eluting stent was successfully placed, and does not overlap previously placed stent.  Ost RCA lesion, 70 %stenosed.  Post intervention, there is a 0% residual stenosis.    Acute inferior ST elevation MI secondary to total occlusion of the distal RCA. This culprit vessel is tortuous and additionally contained a high-grade mid vessel stenosis within a region of tortuosity and significant ostial disease.  Successful recanalization of the culprit right coronary artery and implantation of 3 stents. A distal 3.0 x 20 Promus Premier DES reduced to 100%  stenosis to 0%. Mid vessel 3.5 x 28 mm Promus Premier reduced the 80% stenosis to 0%. Ostial to proximal 3.5 x 12 mm Promus Premier reduced the ostial 70% stenosis to 0%. TIMI grade 3 flow was brisk post procedure with significant resolution of symptoms.  Widely patent left coronary system with nonobstructive disease present.  Inferobasal and mid inferior wall akinesis with EF 45-50%.  Failure to complete the procedure from the right radial approach due to radial artery spasm (unacceptable arm pain) and inability to control catheter movement (unable to obtain adequate guide support).  RECOMMENDATIONS:   Dual antiplatelet therapy 12 months and preferably longer  Aggressive risk factor modification including smoking cessation  Potential discharge in 48 hours depending upon clinical course   06/2014 GXT Patient exercised according to the Bruce protocol for 6 min 37 sec, achieving 8.90 METs. Resting heart rate increased from 88 bpm to 157 bpm (94% of THR) and resting bp increased from 125/92 up to 150/93. The test was stopped due to fatigue, the patient did not experience any chest pain. Baseline EKG showed NSR. Stress EKG showed no specific ischemic changes and no significant arrhythmias. O2 sats were 95-97% throughout the study.  Conclusions 1. Negative exercise stress test for ischemia 2. Duke treadmill score of 7 consistent with low risk 3. No evidence of oxygen desaturation during exercise 4. Very good exercise tolerance (110% of predicted based on age and gender)  Assessment and Plan   1. CAD - recent chest pain concerning for possible cardiac etiology - recent extensive stenting to RCA, she also had a bordeline LCX at time of last cath - we will arrange repeat cath to reevaluate her RCA s/p extensive intervention, may need consideration for FFR of LCX lesion reported 70-75% - compliant with ASA and brillinta, poor compliance with all other meds. Strongly encouarged better  compliance   I have reviewed the risks, indications, and alternatives to cardiac catheterization, possible angioplasty, and stenting with the patient. Risks include but are not limited to bleeding, infection, vascular injury, stroke, myocardial infection, arrhythmia, kidney injury, radiation-related injury in the case of prolonged fluoroscopy use, emergency cardiac surgery, and death. The patient understands the risks of serious complication is 1-2 in 1000 with diagnostic cardiac cath and 1-2% or less with angioplasty/stenting.     Juwana Thoreson F. Romanda Turrubiates, M.D. 

## 2016-02-01 NOTE — Progress Notes (Signed)
ANTICOAGULATION CONSULT NOTE - Initial Consult  Pharmacy Consult for Heparin Indication: atrial fibrillation  Allergies  Allergen Reactions  . Indomethacin Other (See Comments)    Patient has bad side effects "suicidal thoughts/homicidal thoughts"    Patient Measurements: Height: 5' 2.5" (158.8 cm) Weight: 161 lb (73 kg) IBW/kg (Calculated) : 51.25  Vital Signs: Temp: 98.2 F (36.8 C) (10/12 0718) Temp Source: Oral (10/12 0718) BP: 129/78 (10/12 1620) Pulse Rate: 63 (10/12 1620)  Labs: No results for input(s): HGB, HCT, PLT, APTT, LABPROT, INR, HEPARINUNFRC, HEPRLOWMOCWT, CREATININE, CKTOTAL, CKMB, TROPONINI in the last 72 hours.  Estimated Creatinine Clearance: 74.4 mL/min (by C-G formula based on SCr of 0.77 mg/dL).   Medical History: Past Medical History:  Diagnosis Date  . Arthritis   . CAD (coronary artery disease)    a. inferior STEMI 10/2015 s/p DESx3 to RCA.  Marland Kitchen Chronic headaches   . Colitis   . Depression   . Essential hypertension   . Fibromyalgia   . GERD (gastroesophageal reflux disease)   . History of ST elevation myocardial infarction (STEMI)   . Hyperlipidemia   . Hypothyroidism   . LVF (left ventricular failure) (HCC)    a. Transient LV failure during STEMI 10/2015 - EF 45-50% with f/u EF 55% by echo same adm.  . Tobacco abuse    Assessment: 55 year old female s/p cath to resume heparin 8 hours post sheath removal at 1530 pm for Afib  Goal of Therapy:  Heparin level 0.3-0.7 units/ml Monitor platelets by anticoagulation protocol: Yes   Plan:  Heparin at 1100 units / hr starting at 2330 pm 8 hour heparin level Daily labs  Thank you Okey Regal, PharmD [phone]  Elwin Sleight 02/01/2016,5:19 PM

## 2016-02-01 NOTE — Interval H&P Note (Signed)
Cath Lab Visit (complete for each Cath Lab visit)  Clinical Evaluation Leading to the Procedure:   ACS: No.  Non-ACS:    Anginal Classification: CCS III  Anti-ischemic medical therapy: Minimal Therapy (1 class of medications)  Non-Invasive Test Results: No non-invasive testing performed  Prior CABG: No previous CABG      History and Physical Interval Note:  02/01/2016 9:44 AM  Angela Roth  has presented today for surgery, with the diagnosis of Chest Pain  The various methods of treatment have been discussed with the patient and family. After consideration of risks, benefits and other options for treatment, the patient has consented to  Procedure(s): Left Heart Cath and Coronary Angiography (N/A) as a surgical intervention .  The patient's history has been reviewed, patient examined, no change in status, stable for surgery.  I have reviewed the patient's chart and labs.  Questions were answered to the patient's satisfaction.     Lyn Records III

## 2016-02-02 ENCOUNTER — Telehealth: Payer: Self-pay

## 2016-02-02 ENCOUNTER — Other Ambulatory Visit: Payer: Self-pay | Admitting: Cardiology

## 2016-02-02 ENCOUNTER — Encounter (HOSPITAL_COMMUNITY): Payer: Self-pay | Admitting: Interventional Cardiology

## 2016-02-02 DIAGNOSIS — I480 Paroxysmal atrial fibrillation: Secondary | ICD-10-CM

## 2016-02-02 LAB — LIPID PANEL
CHOL/HDL RATIO: 6.6 ratio
CHOLESTEROL: 198 mg/dL (ref 0–200)
HDL: 30 mg/dL — AB (ref >40)
LDL CALC: 110 mg/dL — AB (ref 0–99)
TRIGLYCERIDES: 290 mg/dL — AB (ref <150)
VLDL: 58 mg/dL — AB (ref 0–40)

## 2016-02-02 LAB — BASIC METABOLIC PANEL
ANION GAP: 9 (ref 5–15)
BUN: <5 mg/dL — ABNORMAL LOW (ref 6–20)
CHLORIDE: 106 mmol/L (ref 101–111)
CO2: 20 mmol/L — AB (ref 22–32)
CREATININE: 0.69 mg/dL (ref 0.44–1.00)
Calcium: 8.3 mg/dL — ABNORMAL LOW (ref 8.9–10.3)
GFR calc non Af Amer: >60 mL/min (ref >60)
Glucose, Bld: 118 mg/dL — ABNORMAL HIGH (ref 65–99)
POTASSIUM: 3.7 mmol/L (ref 3.5–5.1)
SODIUM: 135 mmol/L (ref 135–145)

## 2016-02-02 LAB — CBC
HCT: 36.1 % (ref 36.0–46.0)
HCT: 35.9 % — ABNORMAL LOW (ref 36.0–46.0)
HEMOGLOBIN: 12 g/dL (ref 12.0–15.0)
Hemoglobin: 11.9 g/dL — ABNORMAL LOW (ref 12.0–15.0)
MCH: 30.7 pg (ref 26.0–34.0)
MCH: 30.7 pg (ref 26.0–34.0)
MCHC: 33.2 g/dL (ref 30.0–36.0)
MCHC: 33.1 g/dL (ref 30.0–36.0)
MCV: 92.3 fL (ref 78.0–100.0)
MCV: 92.8 fL (ref 78.0–100.0)
PLATELETS: 254 10*3/uL (ref 150–400)
Platelets: 233 10*3/uL (ref 150–400)
RBC: 3.91 MIL/uL (ref 3.87–5.11)
RBC: 3.87 MIL/uL (ref 3.87–5.11)
RDW: 13.5 % (ref 11.5–15.5)
RDW: 13.5 % (ref 11.5–15.5)
WBC: 8.7 10*3/uL (ref 4.0–10.5)
WBC: 8.7 10*3/uL (ref 4.0–10.5)

## 2016-02-02 LAB — TROPONIN I: Troponin I: <0.03 ng/mL (ref <0.03)

## 2016-02-02 MED ORDER — CYCLOBENZAPRINE HCL 10 MG PO TABS: 10.0000 mg | ORAL_TABLET | Freq: Three times a day (TID) | ORAL | 0 refills | Status: DC | PRN

## 2016-02-02 MED ORDER — CARVEDILOL 6.25 MG PO TABS
6.2500 mg | ORAL_TABLET | Freq: Two times a day (BID) | ORAL | Status: DC
  Administered 2016-02-02: 6.25 mg via ORAL
  Filled 2016-02-02: qty 1

## 2016-02-02 MED ORDER — ISOSORBIDE MONONITRATE ER 30 MG PO TB24: 30.0000 mg | ORAL_TABLET | Freq: Every day | ORAL | 12 refills | Status: DC

## 2016-02-02 MED ORDER — ASPIRIN EC 81 MG PO TBEC: 81.0000 mg | DELAYED_RELEASE_TABLET | Freq: Every day | ORAL | Status: AC

## 2016-02-02 MED ORDER — ISOSORBIDE MONONITRATE ER 30 MG PO TB24
30.0000 mg | ORAL_TABLET | Freq: Every day | ORAL | Status: DC
  Administered 2016-02-02: 30 mg via ORAL
  Filled 2016-02-02: qty 1

## 2016-02-02 MED ORDER — CARVEDILOL 6.25 MG PO TABS: 6.2500 mg | ORAL_TABLET | Freq: Two times a day (BID) | ORAL | 12 refills | Status: DC

## 2016-02-02 NOTE — Telephone Encounter (Signed)
Entered into [employer] for event monitor ,pt  Without insurance,letter mailed to [employer],mitch aware,monitor to be mailed to pt's home

## 2016-02-02 NOTE — Care Management Note (Addendum)
Case Management Note  Patient Details  Name: Angela Roth MRN: [id number] Date of Birth: 1960/07/07  Subjective/Objective:       CM following for progression and d/c planning.             Action/Plan: 02/02/2016 Met with pt re d/c medications. This pt has been on Brilinta prior to hospitalization and states that her cardiologist provides samples. Pt given [facility] letter for new prescriptions and her pharmacy , [facility] in [place] is on the list of participating pharmacies.  Explained to the pt that the [facility] letter will provide a 30 day supply of any new meds for $3 per prescription. Pt PA called and prescriptions requested as this letter will not work with faxed prescriptions. Pt with no HH or DME needs and ready for d/c to home today.  Pt states that she has applied for Medicaid and application is pending.  Expected Discharge Date:    02/02/2016              Expected Discharge Plan:  Home/Self Care  In-House Referral:  NA  Discharge planning Services  CM Consult, [place] [facility]  Post Acute Care Choice:  NA Choice offered to:  NA  DME Arranged:  N/A DME Agency:  NA  HH Arranged:  NA HH Agency:  NA  Status of Service:  Completed, signed off  If discussed at [place] of Stay Meetings, dates discussed:    Additional Comments:  Adron Bene, RN 02/02/2016, 12:30 PM

## 2016-02-02 NOTE — Progress Notes (Signed)
CSW received consult regarding homelessness. Patient stated that she was a week away from having her power/phone/internet turned off in her trailer. Patient reported that her 56 year old son lives with her and he has a mental health disorder so he does not hold down a job to help pay the bills. Patients spouse has a two bedroom trailer in [place], but patient states she cannot physically pack her stuff up in boxes. She also reports that she wants to support her son by staying in [place] where his long time girlfriend and friends are. She has asked for help from DSS (she also gets a [place] check that only covers rent and car insurance) but she states that it would only be a short term solution. CSW resources provided include DSS in [place].   CSW signing off.  Osborne [employer] LCSWA [phone]

## 2016-02-02 NOTE — Discharge Summary (Signed)
Discharge Summary    Roth ID: Angela Roth,  MRN: [id number], DOB/AGE: Jun 11, 1960 55 y.o.  Admit date: 02/01/2016 Discharge date: 02/02/2016  Primary Care Provider: Josue Hector Primary Cardiologist: Dr. Wyline Mood  Discharge Diagnoses    Principal Problem:   Atrial fibrillation with rapid ventricular response (HCC) Active Problems:   Hyperlipidemia   Tobacco abuse   Essential hypertension   CAD (coronary artery disease)   Chest pain at rest   Anxiety disorder   Chronic prescription benzodiazepine use   Pure hypercholesterolemia   Atrial fibrillation with RVR (HCC)   Allergies Allergies  Allergen Reactions  . Indomethacin Other (See Comments)    Roth has bad side effects "suicidal thoughts/homicidal thoughts"    Diagnostic Studies/Procedures         Widely patent right coronary stents.  Eccentric 65-75% stenosis in Angela mid circumflex. Beyond Angela mid circumflex there is diffuse 50-60% narrowing in Angela circumflex and a region that is of small caliber FFR on Angela eccentric mid stenosis was 0.93 (noncritical).  40% left main. Catheter damping with deep engagement. Angiographically unchanged from prior study.  Irregularities noted in Angela mid LAD and diagonal.  Overall normal LV function with inferobasal hypokinesis. EF 55%.  Development of atrial fibrillation with RVR at Angela completion of FFR resulting in chest discomfort, dyspnea, and anxiety. Manage with IV amiodarone. Whether Angela atrial fibrillation is situational or has been part of her clinical syndrome out of hospital is uncertain. She will need thirty-day continuous monitoring.  Recommendation:  Admit to stepdown  IV amiodarone to convert atrial fibrillation.  IV heparin to begin in 8 hours if still in atrial fibrillation.  Medical therapy for circumflex disease. Long-acting nitrates will be helpful.  Smoking cessation.  Thirty-day continuous monitoring as an outpatient.  Discharge  in a.m. assuming atrial fibrillation converts to normal sinus rhythm.  _____________   History of Present Illness    Angela Roth is a 56 year old female with a past medical history of tobacco abuse, HLD, CAD s/p inferior STEMI in July 2017 when she got 3 DES to her RCA, also with HTN, and depression.   She is followed by Dr. Wyline Mood and was last seen on 01/24/16 and reported chest pain that she described as dull in intensity but associated with both rest and activity. Did report feeling SOB and dizzy with Angela pain. She has a history of poor compliance with medications (other than ASA and Brilinta). She was referred for left heart catheterization, which was preformed on 02/01/16.    Hospital Course     Cath report above. Stents in her RCA were widely patent. Medical therapy was recommended for Angela disease in her circumflex. Isosorbide will be added to her medication regimen.   She developed transient atrial fibrillation during her procedure, amiodarone was started but discontinued as she converted to NSR. Her Coreg was increased to 6.25mg  BID for rate control. She will need to wear a 30 day event monitor to rule out any further Afib, so that we can assess her need for antiarrhythmics.   She reported compliance with her Brilinta and ASA, although she has multiple concerns regarding her finances and even homelessness.   Angela Roth was somewhat angry and dissatisfied that she has daily chest pain. We talked for a long time about how smoking can affect Angela arteries in Angela heart, cessation was encouraged and she was receptive but reports that smoking is her main stress reliever.   Social consult was requested  by Angela Roth as she has limited resources and will likely not have a home next week. She was given resources for DSS.   She was chest pain free during admission. She was seen today by Dr. Herbie Baltimore and deemed suitable for discharge.  _____________  Discharge Vitals Blood pressure 117/81,  pulse 65, temperature 97.9 F (36.6 C), temperature source Oral, resp. rate 12, height 5' 2.5" (1.588 m), weight 161 lb (73 kg), SpO2 98 %.  Filed Weights   02/01/16 0718  Weight: 161 lb (73 kg)    Labs & Radiologic Studies     CBC  Recent Labs  02/02/16 0520 02/02/16 0723  WBC 8.7 8.7  HGB 12.0 11.9*  HCT 36.1 35.9*  MCV 92.3 92.8  PLT 233 254   Basic Metabolic Panel  Recent Labs  02/01/16 1736 02/02/16 0520  NA 137 135  K 3.5 3.7  CL 109 106  CO2 20* 20*  GLUCOSE 96 118*  BUN 6 <5*  CREATININE 0.69 0.69  CALCIUM 8.1* 8.3*   Liver Function Tests  Recent Labs  02/01/16 1736  AST 19  ALT 17  ALKPHOS 56  BILITOT 0.4  PROT 5.9*  ALBUMIN 3.4*   Cardiac Enzymes  Recent Labs  02/01/16 1736 02/01/16 2316 02/02/16 0520  TROPONINI <0.03 <0.03 <0.03   Fasting Lipid Panel  Recent Labs  02/02/16 0520  CHOL 198  HDL 30*  LDLCALC 110*  TRIG 290*  CHOLHDL 6.6   Thyroid Function Tests  Recent Labs  02/01/16 1736  TSH 4.193    No results found.  Disposition   Pt is being discharged home today in good condition.  Follow-up Plans & Appointments    Follow-up Information    Jacolyn Reedy, PA-C Follow up on 02/14/2016.   Specialty:  Cardiology Why:  at 11:40am with Dr. Verna Czech PA for hospital follow up. Angela office will call you regarding 30 day heart monitor set up.  Contact information: [address] [place] [address] [phone]          Discharge Instructions    Diet - low sodium heart healthy    Complete by:  As directed    Increase activity slowly    Complete by:  As directed       Discharge Medications   Current Discharge Medication List    START taking these medications   Details  aspirin EC 81 MG tablet Take 1 tablet (81 mg total) by mouth daily.    isosorbide mononitrate (IMDUR) 30 MG 24 hr tablet Take 1 tablet (30 mg total) by mouth daily. Qty: 30 tablet, Refills: 12      CONTINUE these medications which  have CHANGED   Details  carvedilol (COREG) 6.25 MG tablet Take 1 tablet (6.25 mg total) by mouth 2 (two) times daily with a meal. Qty: 60 tablet, Refills: 12    !! cyclobenzaprine (FLEXERIL) 10 MG tablet Take 1 tablet (10 mg total) by mouth 3 (three) times daily as needed for muscle spasms. Qty: 30 tablet, Refills: 0     !! - Potential duplicate medications found. Please discuss with provider.    CONTINUE these medications which have NOT CHANGED   Details  acetaminophen (TYLENOL) 500 MG tablet Take 1,000 mg by mouth every 6 (six) hours as needed for headache.    alum & mag hydroxide-simeth (MAALOX/MYLANTA) 200-200-20 MG/5ML suspension Take 30 mLs by mouth every 6 (six) hours as needed for indigestion or heartburn.    atorvastatin (LIPITOR) 80  MG tablet Take 1 tablet (80 mg total) by mouth daily at 6 PM. Qty: 90 tablet, Refills: 3    !! cyclobenzaprine (FLEXERIL) 10 MG tablet Take 10 mg by mouth 3 (three) times daily as needed for muscle spasms.     Melatonin 3 MG TABS Take 3 mg by mouth at bedtime as needed (sleep).     meloxicam (MOBIC) 15 MG tablet TAKE ONE TABLET BY MOUTH ONCE DAILY AS NEEDED    St Johns Wort 300 MG CAPS Take 300 mg by mouth daily as needed (mood support).    ticagrelor (BRILINTA) 90 MG TABS tablet Take 1 tablet (90 mg total) by mouth 2 (two) times daily. Qty: 60 tablet, Refills: 11    LORazepam (ATIVAN) 1 MG tablet Take 1 tablet (1 mg total) by mouth every 6 (six) hours as needed for anxiety. Qty: 10 tablet, Refills: 0    nitroGLYCERIN (NITROSTAT) 0.4 MG SL tablet Place 1 tablet (0.4 mg total) under Angela tongue every 5 (five) minutes as needed for chest pain. Qty: 25 tablet, Refills: 3    simethicone (MYLICON) 80 MG chewable tablet Chew 80 mg by mouth every 6 (six) hours as needed for flatulence.     !! - Potential duplicate medications found. Please discuss with provider.    STOP taking these medications     aspirin 81 MG chewable tablet              Outstanding Labs/Studies    Duration of Discharge Encounter   Greater than 30 minutes including physician time.  Signed, Little IshikawaErin E Smith NP 02/02/2016, 12:45 PM   Angela Roth has been seen in conjunction with Suzzette RighterErin Smith, NP. All aspects of care have been considered and discussed. Angela Roth has been personally interviewed, examined, and all clinical data has been reviewed.   Roth of Dr. Wyline MoodBranch. Underwent cardiac catheterization for recurrent chest pressure occurring both at rest and with exertion. Significant history is that at times she feels as though her heart is pounding/racing during these episodes.   There is significant anxiety/depression/stress overlay involved in this Roth's current clinical condition.  Cardiac catheterization was recommended by Dr. Wyline MoodBranch because of Angela symptoms. Angela right coronary that was stented during STEMI earlier in Angela year was found to be widely patent. She has moderately severe mid and diffuse distal circumflex disease which appeared less significantly involved than it did at Angela time of acute infarction. FFR on Angela mid segment did not reveal significant hemodynamic abnormality with value of 0.93.  At completion of FFR study Angela Roth developed atrial fibrillation with significant worsening of chest discomfort. Rather than being treated as an outpatient she was admitted to Angela hospital for management of atrial fibrillation. IV amiodarone, up titration of beta blocker therapy, and addition of long-acting nitrates were accomplished during Angela hospital stay. She converted on IV amiodarone.  Plan is risk factor modification, medical therapy of circumflex coronary disease, smoking cessation, and she will need a thirty-day continuous monitor to exclude recurrent/paroxysmal atrial fibrillation.

## 2016-02-02 NOTE — Progress Notes (Signed)
   Now back in NSR.  Prolonged CP with negative cardiac markers.  DC amiodarone. Will increase beta blocker intensity.  Add LA nitrate to regimen to help control ? Angina.  Needs 30 day OP monitor to r/o recurrent AF.  Eligible for DC is after oral meds on board for several, BP and HR are okay.  F/u with Dr. Wyline Mood.

## 2016-02-02 NOTE — Progress Notes (Signed)
Patient has been discharged with AVS and scripts. IV was removed. Patient's husband was here to give the ride.

## 2016-02-09 ENCOUNTER — Ambulatory Visit (INDEPENDENT_AMBULATORY_CARE_PROVIDER_SITE_OTHER): Payer: Self-pay

## 2016-02-09 DIAGNOSIS — I480 Paroxysmal atrial fibrillation: Secondary | ICD-10-CM

## 2016-02-14 ENCOUNTER — Encounter: Payer: Self-pay | Admitting: Physician Assistant

## 2016-02-14 ENCOUNTER — Ambulatory Visit (INDEPENDENT_AMBULATORY_CARE_PROVIDER_SITE_OTHER): Payer: Self-pay | Admitting: Physician Assistant

## 2016-02-14 VITALS — BP 138/86 | HR 72 | Ht 63.0 in | Wt 163.0 lb

## 2016-02-14 DIAGNOSIS — E7800 Pure hypercholesterolemia, unspecified: Secondary | ICD-10-CM

## 2016-02-14 DIAGNOSIS — Z720 Tobacco use: Secondary | ICD-10-CM

## 2016-02-14 DIAGNOSIS — I2510 Atherosclerotic heart disease of native coronary artery without angina pectoris: Secondary | ICD-10-CM

## 2016-02-14 DIAGNOSIS — R0789 Other chest pain: Secondary | ICD-10-CM

## 2016-02-14 DIAGNOSIS — I4891 Unspecified atrial fibrillation: Secondary | ICD-10-CM

## 2016-02-14 DIAGNOSIS — I10 Essential (primary) hypertension: Secondary | ICD-10-CM

## 2016-02-14 NOTE — Patient Instructions (Signed)
Your physician recommends that you schedule a follow-up appointment in: 2 Months with Dr. Wyline Mood  Your physician recommends that you continue on your current medications as directed. Please refer to the Current Medication list given to you today.  Your physician recommends that you return for lab work in: 6 Weeks ( 03/27/16)  If you need a refill on your cardiac medications before your next appointment, please call your pharmacy.  Thank you for choosing [facility]!

## 2016-02-14 NOTE — Progress Notes (Signed)
Cardiology Office Note    Date:  02/14/2016   ID:  Angela ButtnerDebra Roth, DOB Aug 22, 1960, MRN [id number]  PCP:  Josue HectorNYLAND,Angela ROBERT, MD  Cardiologist: Dr. Wyline MoodBranch   Chief Complaint  Patient presents with  . Follow-up    History of Present Illness:  Angela Roth is a 55 y.o. female with history of CAD status post inferior STEMI treated with DES to the RCA 3 who had recurrent chest pain and repeat cath was recommended to reevaluate her RCA status post extensive intervention and a circumflex lesion that was 70-75% stenosis.  She underwent repeat cardiac catheterization 02/01/16 that showed widely patent RCA stents. There was an eccentric 65-75% mid circumflex unchanged from prior study. Irregularities in the mid LAD and diagonal inferior basal hypokinesis EF 55%. The patient developed atrial fibrillation with RVR at the completion of FFR resulting in chest discomfort dyspnea and anxiety. Was managed with IV amiodarone. Whether the atrial fibrillation was situational or has been part of her clinical syndrome out of hospital is uncertain. She converted to normal sinus rhythm. Her Coreg was increased to 6.25 mg twice a day for rate control and 30 day event monitor was ordered. Social services was consult to regarding her finances and even homelessness.  Patient comes in today for routine follow-up. She hasn't filled the prescription for Imdur or Lipitor yet but is getting it today. She has good days and bad days. Some days she'll have achy chest pressure, fatigue and shortness of breath and other days she actually has energy and feels good. Last night she felt some palpitations but when she took her pulse it was not racing or skipping. She is wearing the 30 day monitor and we pulled up strips that show only normal sinus rhythm. No atrial fibrillation.she is waiting on medical assistance to get Chantix to help her quit smoking. She is currently rolling her own cigarettes.    Past Medical History:    Diagnosis Date  . Arthritis   . CAD (coronary artery disease)    a. inferior STEMI 10/2015 s/p DESx3 to RCA.  Marland Kitchen. Chronic headaches   . Colitis   . Depression   . Essential hypertension   . Fibromyalgia   . GERD (gastroesophageal reflux disease)   . History of ST elevation myocardial infarction (STEMI)   . Hyperlipidemia   . Hypothyroidism   . LVF (left ventricular failure) (HCC)    a. Transient LV failure during STEMI 10/2015 - EF 45-50% with f/u EF 55% by echo same adm.  . Tobacco abuse     Past Surgical History:  Procedure Laterality Date  . BACK SURGERY    . CARDIAC CATHETERIZATION N/A 11/18/2015   Procedure: Left Heart Cath and Coronary Angiography;  Surgeon: Lyn RecordsHenry W Smith, MD;  [facility];  Service: Cardiovascular;  Laterality: N/A;  . CARDIAC CATHETERIZATION N/A 11/18/2015   Procedure: Coronary Stent Intervention;  Surgeon: Lyn RecordsHenry W Smith, MD;  [facility];  Service: Cardiovascular;  Laterality: N/A;  . CARDIAC CATHETERIZATION N/A 02/01/2016   Procedure: Left Heart Cath and Coronary Angiography;  Surgeon: Lyn RecordsHenry W Smith, MD;  [facility];  Service: Cardiovascular;  Laterality: N/A;  . CARDIAC CATHETERIZATION N/A 02/01/2016   Procedure: Intravascular Pressure Wire/FFR Study;  Surgeon: Lyn RecordsHenry W Smith, MD;  [facility];  Service: Cardiovascular;  Laterality: N/A;  . CHOLECYSTECTOMY    . TOTAL ABDOMINAL HYSTERECTOMY W/ BILATERAL SALPINGOOPHORECTOMY      Current Medications: Outpatient  Medications Prior to Visit  Medication Sig Dispense Refill  . acetaminophen (TYLENOL) 500 MG tablet Take 1,000 mg by mouth every 6 (six) hours as needed for headache.    Marland Kitchen alum & mag hydroxide-simeth (MAALOX/MYLANTA) 200-200-20 MG/5ML suspension Take 30 mLs by mouth every 6 (six) hours as needed for indigestion or heartburn.    Marland Kitchen aspirin EC 81 MG tablet Take 1 tablet (81 mg total) by mouth daily.    Marland Kitchen atorvastatin (LIPITOR) 80 MG  tablet Take 1 tablet (80 mg total) by mouth daily at 6 PM. 90 tablet 3  . carvedilol (COREG) 6.25 MG tablet Take 1 tablet (6.25 mg total) by mouth 2 (two) times daily with a meal. 60 tablet 12  . cyclobenzaprine (FLEXERIL) 10 MG tablet Take 10 mg by mouth 3 (three) times daily as needed for muscle spasms.     . cyclobenzaprine (FLEXERIL) 10 MG tablet Take 1 tablet (10 mg total) by mouth 3 (three) times daily as needed for muscle spasms. 30 tablet 0  . isosorbide mononitrate (IMDUR) 30 MG 24 hr tablet Take 1 tablet (30 mg total) by mouth daily. 30 tablet 12  . LORazepam (ATIVAN) 1 MG tablet Take 1 tablet (1 mg total) by mouth every 6 (six) hours as needed for anxiety. 10 tablet 0  . Melatonin 3 MG TABS Take 3 mg by mouth at bedtime as needed (sleep).     . meloxicam (MOBIC) 15 MG tablet TAKE ONE TABLET BY MOUTH ONCE DAILY AS NEEDED    . nitroGLYCERIN (NITROSTAT) 0.4 MG SL tablet Place 1 tablet (0.4 mg total) under the tongue every 5 (five) minutes as needed for chest pain. 25 tablet 3  . simethicone (MYLICON) 80 MG chewable tablet Chew 80 mg by mouth every 6 (six) hours as needed for flatulence.    . St Johns Wort 300 MG CAPS Take 300 mg by mouth daily as needed (mood support).    . ticagrelor (BRILINTA) 90 MG TABS tablet Take 1 tablet (90 mg total) by mouth 2 (two) times daily. 60 tablet 11   No facility-administered medications prior to visit.      Allergies:   Indomethacin   Social History   Social History  . Marital status: Divorced    Spouse name: N/A  . Number of children: N/A  . Years of education: N/A   Social History Main Topics  . Smoking status: Current Every Day Smoker    Packs/day: 1.00    Years: 42.00    Types: Cigarettes    Start date: 09/12/1971  . Smokeless tobacco: Never Used  . Alcohol use No  . Drug use: No  . Sexual activity: Yes    Partners: Male   Other Topics Concern  . None   Social History Narrative  . None     Family History:  The patient's    family history includes Asthma in her brother; Lung cancer in her maternal grandmother; Ovarian cancer in her maternal grandmother.   ROS:   Please see the history of present illness.    Review of Systems  Constitution: Negative.  HENT: Negative.   Eyes: Negative.   Cardiovascular: Positive for chest pain, dyspnea on exertion and palpitations.  Respiratory: Positive for shortness of breath.   Hematologic/Lymphatic: Negative.   Musculoskeletal: Negative.  Negative for joint pain.  Gastrointestinal: Negative.   Genitourinary: Negative.   Neurological: Negative.    All other systems reviewed and are negative.   PHYSICAL EXAM:   VS:  BP 138/86   Pulse 72   Ht  (1.6 m)   Wt 163 lb (73.9 kg)   SpO2 94%   BMI 28.87 kg/m   Physical Exam  GEN: Well nourished, well developed, in no acute distress  Neck: no JVD, carotid bruits, or masses Cardiac:RRR; no murmurs, rubs, or gallops  Respiratory:  clear to auscultation bilaterally, normal work of breathing GI: soft, nontender, nondistended, + BS Ext: without cyanosis, clubbing, or edema, Good distal pulses bilaterally MS: no deformity or atrophy  Skin: warm and dry, no rash Neuro:  Alert and Oriented x 3, Strength and sensation are intact Psych: euthymic mood, full affect  Wt Readings from Last 3 Encounters:  02/14/16 163 lb (73.9 kg)  02/01/16 161 lb (73 kg)  01/24/16 162 lb (73.5 kg)      Studies/Labs Reviewed:   EKG:  EKG is not ordered today.    Recent Labs: 02/01/2016: ALT 17; B Natriuretic Peptide 54.7; TSH 4.193 02/02/2016: BUN <5; Creatinine, Ser 0.69; Hemoglobin 11.9; Platelets 254; Potassium 3.7; Sodium 135   Lipid Panel    Component Value Date/Time   CHOL 198 02/02/2016 0520   TRIG 290 (H) 02/02/2016 0520   HDL 30 (L) 02/02/2016 0520   CHOLHDL 6.6 02/02/2016 0520   VLDL 58 (H) 02/02/2016 0520   LDLCALC 110 (H) 02/02/2016 0520    Additional studies/ records that were reviewed today include:    Widely  patent right coronary stents.  Eccentric 65-75% stenosis in the mid circumflex. Beyond the mid circumflex there is diffuse 50-60% narrowing in the circumflex and a region that is of small caliber FFR on the eccentric mid stenosis was 0.93 (noncritical).  40% left main. Catheter damping with deep engagement. Angiographically unchanged from prior study.  Irregularities noted in the mid LAD and diagonal.  Overall normal LV function with inferobasal hypokinesis. EF 55%.  Development of atrial fibrillation with RVR at the completion of FFR resulting in chest discomfort, dyspnea, and anxiety. Manage with IV amiodarone. Whether the atrial fibrillation is situational or has been part of her clinical syndrome out of hospital is uncertain. She will need thirty-day continuous monitoring.   10/2015 echo Study Conclusions   - Left ventricle: The cavity size was normal. Wall thickness was   normal. Basal inferoseptal hypokinesis, basal to mid inferior   hypokinesis. The estimated ejection fraction was 55%. Doppler   parameters are consistent with abnormal left ventricular   relaxation (grade 1 diastolic dysfunction). - Aortic valve: There was no stenosis. - Mitral valve: There was trivial regurgitation. - Right ventricle: The cavity size was normal. Systolic function   was normal. - Pulmonary arteries: No complete TR doppler jet so unable to   estimate PA systolic pressure. - Systemic veins: IVC not visualized.   Impressions:   - Normal LV size with EF 55%. Wall motion abnormalities as noted   above. Normal RV size and systolic function. No significant   valvular abnormalities.   10/2015 LHC  LM lesion, 40 %stenosed.  Prox Cx to Mid Cx lesion, 75 %stenosed.  Mid Cx to Dist Cx lesion, 70 %stenosed.  Prox LAD to Dist LAD lesion, 30 %stenosed.  The left ventricular systolic function is normal.  LV end diastolic pressure is normal.  There is no mitral valve regurgitation.  The left  ventricular ejection fraction is 45-50% by visual estimate.  A STENT PROMUS PREM MR 3.0X20 drug eluting stent was successfully placed, and does not overlap previously placed stent.  Dist  RCA lesion, 100 %stenosed.  Post intervention, there is a 0% residual stenosis.  A STENT PROMUS PREM MR 3.5X28 drug eluting stent was successfully placed, and does not overlap previously placed stent.  Mid RCA lesion, 80 %stenosed.  Post intervention, there is a 0% residual stenosis.  A BALLOON [place] EMERGE MR 3.75X12 drug eluting stent was successfully placed, and does not overlap previously placed stent.  Ost RCA lesion, 70 %stenosed.  Post intervention, there is a 0% residual stenosis.    Acute inferior ST elevation MI secondary to total occlusion of the distal RCA. This culprit vessel is tortuous and additionally contained a high-grade mid vessel stenosis within a region of tortuosity and significant ostial disease.  Successful recanalization of the culprit right coronary artery and implantation of 3 stents. A distal 3.0 x 20 Promus Premier DES reduced to 100% stenosis to 0%. Mid vessel 3.5 x 28 mm Promus Premier reduced the 80% stenosis to 0%. Ostial to proximal 3.5 x 12 mm Promus Premier reduced the ostial 70% stenosis to 0%. TIMI grade 3 flow was brisk post procedure with significant resolution of symptoms.  Widely patent left coronary system with nonobstructive disease present.  Inferobasal and mid inferior wall akinesis with EF 45-50%.  Failure to complete the procedure from the right radial approach due to radial artery spasm (unacceptable arm pain) and inability to control catheter movement (unable to obtain adequate guide support).   RECOMMENDATIONS:    Dual antiplatelet therapy 12 months and preferably longer  Aggressive risk factor modification including smoking cessation  Potential discharge in 48 hours depending upon clinical course     ASSESSMENT:    1. CAD in native artery    2. Other chest pain   3. Atrial fibrillation with RVR (HCC)   4. Essential hypertension   5. Tobacco abuse   6. Pure hypercholesterolemia      PLAN:  In order of problems listed above:  CAD status post STEMI treated with drug-eluting stents to the RCA and residual 65-75% mid circumflex that is of small caliber FFR on the eccentric mid stenosis with 0.93 noncritical. Medical management recommended. Follow-up with Dr. branch in 2 months.  Chest pain patient continues to have chest pain. She has not filled the prescription for Imdur but is getting it today. Hopefully this will improve her symptoms.  Atrial fibrillation with RVR at the completion of FFR resulting in chest discomfort, dyspnea, and anxiety. Manage with IV amiodarone. Whether the atrial fibrillation is situational or has been part of her clinical syndrome out of hospital is uncertain. She is wearing a 30 day monitor and has had no further atrial fibrillation thus far.   Essential hypertension controlled  Tobacco abuse smokes getting cessation discussed. She is waiting on a prescription for Chantix.  Hypercholesterolemia has not started Lipitor yet but will today. Check lipid profile and LFTs in 6 weeks.    Medication Adjustments/Labs and Tests Ordered: Current medicines are reviewed at length with the patient today.  Concerns regarding medicines are outlined above.  Medication changes, Labs and Tests ordered today are listed in the Patient Instructions below. Patient Instructions  Your physician recommends that you schedule a follow-up appointment in: 2 Months with Dr. Wyline Mood  Your physician recommends that you continue on your current medications as directed. Please refer to the Current Medication list given to you today.  Your physician recommends that you return for lab work in: 6 Weeks ( 03/27/16)  If you need a refill on your cardiac  medications before your next appointment, please call your pharmacy.  Thank you  for choosing [facility]!        Elson Clan, PA-C  02/14/2016 12:25 PM    [facility] [address] [place], [place], [place]  [address] Phone: [phone]; Fax: [phone]

## 2016-02-26 ENCOUNTER — Ambulatory Visit: Payer: Self-pay | Admitting: Adult Health

## 2016-04-12 ENCOUNTER — Ambulatory Visit (INDEPENDENT_AMBULATORY_CARE_PROVIDER_SITE_OTHER): Payer: Self-pay | Admitting: Cardiology

## 2016-04-12 ENCOUNTER — Encounter: Payer: Self-pay | Admitting: Cardiology

## 2016-04-12 VITALS — BP 162/88 | HR 76 | Ht 62.5 in | Wt 162.0 lb

## 2016-04-12 DIAGNOSIS — I4891 Unspecified atrial fibrillation: Secondary | ICD-10-CM

## 2016-04-12 DIAGNOSIS — I2510 Atherosclerotic heart disease of native coronary artery without angina pectoris: Secondary | ICD-10-CM

## 2016-04-12 DIAGNOSIS — R0789 Other chest pain: Secondary | ICD-10-CM

## 2016-04-12 MED ORDER — CARVEDILOL 12.5 MG PO TABS: 12.5000 mg | ORAL_TABLET | Freq: Two times a day (BID) | ORAL | 3 refills | Status: DC

## 2016-04-12 NOTE — Progress Notes (Signed)
Clinical Summary Angela Roth is a 55 y.o.female seen today for follow up of the following medical problems.   1. CAD - admit 10/2015 with inferior STEMI, received DES to RCA x 3. Echo LVEF 55%. Cath results as reported below.  - ACE-I stopped during admission due to borderline bp She has history of chronic atypical chest pain. Recently she described chest pain symptoms different from her prior episodes, she was referred for caht.   - cath 10/217 with with moderate LCX disease not significant by FFR. Started on nitrates.  Went into afib with RVR during cath. - still with some chest pain at times.  - headaches on imdur, she stopped taked  2. Afib - new diagnosis, occurred during cardiac cath 01/2016. Transiently on IV amiodarone and converted back to NSR. Amio stopped, coreg increased to 6.25mg  bid.  - no arrhythmias noted on follow up 30 day monitor  3. Hyperlipidemia - compliant with statin  Past Medical History:  Diagnosis Date  . Arthritis   . CAD (coronary artery disease)    a. inferior STEMI 10/2015 s/p DESx3 to RCA.  Marland Kitchen Chronic headaches   . Colitis   . Depression   . Essential hypertension   . Fibromyalgia   . GERD (gastroesophageal reflux disease)   . History of ST elevation myocardial infarction (STEMI)   . Hyperlipidemia   . Hypothyroidism   . LVF (left ventricular failure) (HCC)    a. Transient LV failure during STEMI 10/2015 - EF 45-50% with f/u EF 55% by echo same adm.  . Tobacco abuse      Allergies  Allergen Reactions  . Indomethacin Other (See Comments)    Patient has bad side effects "suicidal thoughts/homicidal thoughts"     Current Outpatient Prescriptions  Medication Sig Dispense Refill  . acetaminophen (TYLENOL) 500 MG tablet Take 1,000 mg by mouth every 6 (six) hours as needed for headache.    Marland Kitchen alum & mag hydroxide-simeth (MAALOX/MYLANTA) 200-200-20 MG/5ML suspension Take 30 mLs by mouth every 6 (six) hours as needed for indigestion or  heartburn.    Marland Kitchen aspirin EC 81 MG tablet Take 1 tablet (81 mg total) by mouth daily.    Marland Kitchen atorvastatin (LIPITOR) 80 MG tablet Take 1 tablet (80 mg total) by mouth daily at 6 PM. 90 tablet 3  . carvedilol (COREG) 6.25 MG tablet Take 1 tablet (6.25 mg total) by mouth 2 (two) times daily with a meal. 60 tablet 12  . cyclobenzaprine (FLEXERIL) 10 MG tablet Take 10 mg by mouth 3 (three) times daily as needed for muscle spasms.     . cyclobenzaprine (FLEXERIL) 10 MG tablet Take 1 tablet (10 mg total) by mouth 3 (three) times daily as needed for muscle spasms. 30 tablet 0  . isosorbide mononitrate (IMDUR) 30 MG 24 hr tablet Take 1 tablet (30 mg total) by mouth daily. 30 tablet 12  . LORazepam (ATIVAN) 1 MG tablet Take 1 tablet (1 mg total) by mouth every 6 (six) hours as needed for anxiety. 10 tablet 0  . Melatonin 3 MG TABS Take 3 mg by mouth at bedtime as needed (sleep).     . meloxicam (MOBIC) 15 MG tablet TAKE ONE TABLET BY MOUTH ONCE DAILY AS NEEDED    . nitroGLYCERIN (NITROSTAT) 0.4 MG SL tablet Place 1 tablet (0.4 mg total) under the tongue every 5 (five) minutes as needed for chest pain. 25 tablet 3  . simethicone (MYLICON) 80 MG chewable tablet Chew 80  mg by mouth every 6 (six) hours as needed for flatulence.    . St Johns Wort 300 MG CAPS Take 300 mg by mouth daily as needed (mood support).    . ticagrelor (BRILINTA) 90 MG TABS tablet Take 1 tablet (90 mg total) by mouth 2 (two) times daily. 60 tablet 11   No current facility-administered medications for this visit.      Past Surgical History:  Procedure Laterality Date  . BACK SURGERY    . CARDIAC CATHETERIZATION N/A 11/18/2015   Procedure: Left Heart Cath and Coronary Angiography;  Surgeon: Lyn Records, MD;  Location: [facility] INVASIVE CV LAB;  Service: Cardiovascular;  Laterality: N/A;  . CARDIAC CATHETERIZATION N/A 11/18/2015   Procedure: Coronary Stent Intervention;  Surgeon: Lyn Records, MD;  Location: [facility] INVASIVE CV LAB;  Service:  Cardiovascular;  Laterality: N/A;  . CARDIAC CATHETERIZATION N/A 02/01/2016   Procedure: Left Heart Cath and Coronary Angiography;  Surgeon: Lyn Records, MD;  Location: [facility] INVASIVE CV LAB;  Service: Cardiovascular;  Laterality: N/A;  . CARDIAC CATHETERIZATION N/A 02/01/2016   Procedure: Intravascular Pressure Wire/FFR Study;  Surgeon: Lyn Records, MD;  Location: [facility] INVASIVE CV LAB;  Service: Cardiovascular;  Laterality: N/A;  . CHOLECYSTECTOMY    . TOTAL ABDOMINAL HYSTERECTOMY W/ BILATERAL SALPINGOOPHORECTOMY       Allergies  Allergen Reactions  . Indomethacin Other (See Comments)    Patient has bad side effects "suicidal thoughts/homicidal thoughts"      Family History  Problem Relation Age of Onset  . Lung cancer Maternal Grandmother   . Ovarian cancer Maternal Grandmother   . Asthma Brother      Social History Angela Roth reports that she has been smoking Cigarettes.  She started smoking about 44 years ago. She has a 42.00 pack-year smoking history. She has never used smokeless tobacco. Angela Roth reports that she does not drink alcohol.   Review of Systems CONSTITUTIONAL: No weight loss, fever, chills, weakness or fatigue.  HEENT: Eyes: No visual loss, blurred vision, double vision or yellow sclerae.No hearing loss, sneezing, congestion, runny nose or sore throat.  SKIN: No rash or itching.  CARDIOVASCULAR: per hpi RESPIRATORY: No shortness of breath, cough or sputum.  GASTROINTESTINAL: No anorexia, nausea, vomiting or diarrhea. No abdominal pain or blood.  GENITOURINARY: No burning on urination, no polyuria NEUROLOGICAL: No headache, dizziness, syncope, paralysis, ataxia, numbness or tingling in the extremities. No change in bowel or bladder control.  MUSCULOSKELETAL: No muscle, back pain, joint pain or stiffness.  LYMPHATICS: No enlarged nodes. No history of splenectomy.  PSYCHIATRIC: No history of depression or anxiety.  ENDOCRINOLOGIC: No reports of sweating,  cold or heat intolerance. No polyuria or polydipsia.  Marland Kitchen   Physical Examination Vitals:   04/12/16 1322 04/12/16 1329  BP: (!) 155/103 (!) 162/88  Pulse: 71 76   Vitals:   04/12/16 1322  Weight: 162 lb (73.5 kg)  Height: 5' 2.5" (1.588 m)    Gen: resting comfortably, no acute distress HEENT: no scleral icterus, pupils equal round and reactive, no palptable cervical adenopathy,  CV: RRR, no m/r/g, no jvd Resp: Clear to auscultation bilaterally GI: abdomen is soft, non-tender, non-distended, normal bowel sounds, no hepatosplenomegaly MSK: extremities are warm, no edema.  Skin: warm, no rash Neuro:  no focal deficits Psych: appropriate affect   Diagnostic Studies  10/2015 echo Study Conclusions  - Left ventricle: The cavity size was normal. Wall thickness was normal. Basal inferoseptal hypokinesis, basal to mid inferior  hypokinesis. The estimated ejection fraction was 55%. Doppler parameters are consistent with abnormal left ventricular relaxation (grade 1 diastolic dysfunction). - Aortic valve: There was no stenosis. - Mitral valve: There was trivial regurgitation. - Right ventricle: The cavity size was normal. Systolic function was normal. - Pulmonary arteries: No complete TR doppler jet so unable to estimate PA systolic pressure. - Systemic veins: IVC not visualized.  Impressions:  - Normal LV size with EF 55%. Wall motion abnormalities as noted above. Normal RV size and systolic function. No significant valvular abnormalities.  10/2015 LHC  LM lesion, 40 %stenosed.  Prox Cx to Mid Cx lesion, 75 %stenosed.  Mid Cx to Dist Cx lesion, 70 %stenosed.  Prox LAD to Dist LAD lesion, 30 %stenosed.  The left ventricular systolic function is normal.  LV end diastolic pressure is normal.  There is no mitral valve regurgitation.  The left ventricular ejection fraction is 45-50% by visual estimate.  A STENT PROMUS PREM MR 3.0X20 drug eluting  stent was successfully placed, and does not overlap previously placed stent.  Dist RCA lesion, 100 %stenosed.  Post intervention, there is a 0% residual stenosis.  A STENT PROMUS PREM MR 3.5X28 drug eluting stent was successfully placed, and does not overlap previously placed stent.  Mid RCA lesion, 80 %stenosed.  Post intervention, there is a 0% residual stenosis.  A BALLOON  EMERGE MR 3.75X12 drug eluting stent was successfully placed, and does not overlap previously placed stent.  Ost RCA lesion, 70 %stenosed.  Post intervention, there is a 0% residual stenosis.   Acute inferior ST elevation MI secondary to total occlusion of the distal RCA. This culprit vessel is tortuous and additionally contained a high-grade mid vessel stenosis within a region of tortuosity and significant ostial disease.  Successful recanalization of the culprit right coronary artery and implantation of 3 stents. A distal 3.0 x 20 Promus Premier DES reduced to 100% stenosis to 0%. Mid vessel 3.5 x 28 mm Promus Premier reduced the 80% stenosis to 0%. Ostial to proximal 3.5 x 12 mm Promus Premier reduced the ostial 70% stenosis to 0%. TIMI grade 3 flow was brisk post procedure with significant resolution of symptoms.  Widely patent left coronary system with nonobstructive disease present.  Inferobasal and mid inferior wall akinesis with EF 45-50%.  Failure to complete the procedure from the right radial approach due to radial artery spasm (unacceptable arm pain) and inability to control catheter movement (unable to obtain adequate guide support).  RECOMMENDATIONS:   Dual antiplatelet therapy 12 months and preferably longer  Aggressive risk factor modification including smoking cessation  Potential discharge in 48 hours depending upon clinical course   06/2014 GXT Patient exercised according to the Bruce protocol for 6 min 37 sec, achieving 8.90 METs. Resting heart rate increased from 88 bpm to 157  bpm (94% of THR) and resting bp increased from 125/92 up to 150/93. The test was stopped due to fatigue, the patient did not experience any chest pain. Baseline EKG showed NSR. Stress EKG showed no specific ischemic changes and no significant arrhythmias. O2 sats were 95-97% throughout the study.  Conclusions 1. Negative exercise stress test for ischemia 2. Duke treadmill score of 7 consistent with low risk 3. No evidence of oxygen desaturation during exercise 4. Very good exercise tolerance (110% of predicted based on age and gender)  01/2016 cath Narrative & Impression     Widely patent right coronary stents.  Eccentric 65-75% stenosis in the mid circumflex. Beyond the  mid circumflex there is diffuse 50-60% narrowing in the circumflex and a region that is of small caliber FFR on the eccentric mid stenosis was 0.93 (noncritical).  40% left main. Catheter damping with deep engagement. Angiographically unchanged from prior study.  Irregularities noted in the mid LAD and diagonal.  Overall normal LV function with inferobasal hypokinesis. EF 55%.  Development of atrial fibrillation with RVR at the completion of FFR resulting in chest discomfort, dyspnea, and anxiety. Manage with IV amiodarone. Whether the atrial fibrillation is situational or has been part of her clinical syndrome out of hospital is uncertain. She will need thirty-day continuous monitoring.  Recommendation:  Admit to stepdown  IV amiodarone to convert atrial fibrillation.  IV heparin to begin in 8 hours if still in atrial fibrillation.  Medical therapy for circumflex disease. Long-acting nitrates will be helpful.  Smoking cessation.  Thirty-day continuous monitoring as an outpatient.  Discharge in a.m. assuming atrial fibrillation converts to normal sinus rhythm.      Assessment and Plan  1. CAD - recent repeat cath without any new significant disease - did not tolerate nitrates. We will try increaseing  coreg to 12.5mg  bid and follow chest pain symptoms..  2. Afib - isolated episode during recent cath, no recurrence by 30 day monitor - continue to monitor at this time. At this time we have not committed her to anticoagulation, at this time appears to be isolated episode during heart cath  3. Hyperlipidemi - continue statin in setting of CAD   F/u 2 months   Antoine Poche, M.D.

## 2016-04-12 NOTE — Patient Instructions (Signed)
Your physician wants you to follow-up in: 4 MONTHS WITH DR. BRANCH You will receive a reminder letter in the mail two months in advance. If you don't receive a letter, please call our office to schedule the follow-up appointment.  Your physician has recommended you make the following change in your medication:   INCREASE COREG 12.5 MG TWICE DAILY  Thank you for choosing [facility]!!

## 2016-09-17 ENCOUNTER — Telehealth: Payer: Self-pay | Admitting: Cardiology

## 2016-09-17 NOTE — Telephone Encounter (Signed)
brilinta 90 mg samples 4 bottles lot [id number], exp 11/2018 pt states she will get next week

## 2016-09-17 NOTE — Telephone Encounter (Signed)
Pt is going to be going to the [place] [facility] and would like to know if she could get some samples of ticagrelor (BRILINTA) 90 MG TABS tablet [[id number]]   till she has her apt there b[facility] not [facility] Bril[facility].

## 2016-12-04 ENCOUNTER — Other Ambulatory Visit: Payer: Self-pay

## 2016-12-04 ENCOUNTER — Telehealth: Payer: Self-pay | Admitting: Cardiology

## 2016-12-04 MED ORDER — CLOPIDOGREL BISULFATE 75 MG PO TABS: 75.0000 mg | ORAL_TABLET | Freq: Every day | ORAL | 6 refills | Status: DC

## 2016-12-04 NOTE — Telephone Encounter (Signed)
Spoke with pt. Agreed to switch to PLavix. Called to see what price was and she is able to afford it. Sent RX in to [facility].

## 2016-12-04 NOTE — Telephone Encounter (Signed)
Can stop brillinta, start plavix 75mg  daily. Preferably she would not stop the brillinta until she has the plavix in hand to start, would try not to have a day where she doesn't take one or the other   [name] MD

## 2016-12-04 NOTE — Telephone Encounter (Signed)
Per phone call--pt would like some samples of ticagrelor (BRILINTA) 90 MG TABS tablet [[id number]]  Pt does not have any ins and is seen at the [place], but the [place] is not able to give her this medication

## 2016-12-04 NOTE — Telephone Encounter (Signed)
Pt asks that she be switched to a different medication due to not having insurance and the [place] does not provide this medication. Please advise.

## 2018-01-01 ENCOUNTER — Other Ambulatory Visit (HOSPITAL_COMMUNITY): Payer: Self-pay | Admitting: Internal Medicine

## 2018-01-01 DIAGNOSIS — Z1239 Encounter for other screening for malignant neoplasm of breast: Secondary | ICD-10-CM

## 2018-01-02 ENCOUNTER — Encounter (HOSPITAL_COMMUNITY): Payer: Self-pay

## 2018-01-02 ENCOUNTER — Ambulatory Visit (HOSPITAL_COMMUNITY)
Admission: RE | Admit: 2018-01-02 | Discharge: 2018-01-02 | Disposition: A | Discharge status: Home / Self Care | Payer: Self-pay | Source: Ambulatory Visit | Attending: Internal Medicine | Admitting: Internal Medicine

## 2018-01-02 DIAGNOSIS — Z1231 Encounter for screening mammogram for malignant neoplasm of breast: Secondary | ICD-10-CM | POA: Insufficient documentation

## 2018-01-02 DIAGNOSIS — Z1239 Encounter for other screening for malignant neoplasm of breast: Secondary | ICD-10-CM

## 2018-02-18 ENCOUNTER — Encounter: Payer: Self-pay | Admitting: Gastroenterology

## 2018-04-23 ENCOUNTER — Other Ambulatory Visit: Payer: Self-pay | Admitting: *Deleted

## 2018-04-23 ENCOUNTER — Encounter: Payer: Self-pay | Admitting: *Deleted

## 2018-04-23 ENCOUNTER — Encounter: Payer: Self-pay | Admitting: Gastroenterology

## 2018-04-23 ENCOUNTER — Telehealth: Payer: Self-pay

## 2018-04-23 ENCOUNTER — Ambulatory Visit (INDEPENDENT_AMBULATORY_CARE_PROVIDER_SITE_OTHER): Payer: No Typology Code available for payment source | Admitting: Gastroenterology

## 2018-04-23 DIAGNOSIS — R1013 Epigastric pain: Secondary | ICD-10-CM

## 2018-04-23 DIAGNOSIS — R197 Diarrhea, unspecified: Secondary | ICD-10-CM

## 2018-04-23 MED ORDER — DICYCLOMINE HCL 10 MG PO CAPS: ORAL_CAPSULE | ORAL | 11 refills | Status: DC

## 2018-04-23 MED ORDER — PEG 3350-KCL-NA BICARB-NACL 420 G PO SOLR: 4000.0000 mL | Freq: Once | ORAL | 0 refills | Status: AC

## 2018-04-23 NOTE — Patient Instructions (Signed)
DRINK WATER TO KEEP YOUR URINE LIGHT YELLOW.  AVOID TRIGGERS FOR REFLUX. SEE INFO BELOW.  FOLLOW A LOW FAT DIET. MEATS SHOULD BE BAKED, BROILED, OR BOILED. Avoid fried foods. SEE INFO BELOW.  CHEW ONE TUMS first thing in the morning TO reduce DIARRHEA.  TAKE DICYCLOMINE 30 MINUTES PRIOR TO FIRST MEAL. IT MAY CAUSE DROWSINESS, DRY EYES/MOUTH, BLURRY VISION, OR DIFFICULTY URINATING.  START OMEPRAZOLE.  TAKE 30 MINUTES PRIOR TO YOUR FIRST MEAL.   COMPLETE COLONOSCOPY AND UPPER ENDOSCOPY IN 4-6 WEEKS. YOU MAY BRING THE ENEMA TO ADMINISTER IN THE PREOP AREA.  FOLLOW UP IN 6 MOS.   Lifestyle and home remedies TO CONTROL HEARTBURN You may eliminate or reduce the frequency of heartburn by making the following lifestyle changes:  . Control your weight. Being overweight is a major risk factor for heartburn and GERD. Excess pounds put pressure on your abdomen, pushing up your stomach and causing acid to back up into your esophagus.   . Eat smaller meals. 4 TO 6 MEALS A DAY. This reduces pressure on the lower esophageal sphincter, helping to prevent the valve from opening and acid from washing back into your esophagus.   Allena Earing your belt. Clothes that fit tightly around your waist put pressure on your abdomen and the lower esophageal sphincter.   . Eliminate heartburn triggers. Everyone has specific triggers. Common triggers such as fatty or fried foods, spicy food, tomato sauce, carbonated beverages, alcohol, chocolate, mint, garlic, onion, caffeine and nicotine may make heartburn worse.   Marland Kitchen Avoid stooping or bending. Tying your shoes is OK. Bending over for longer periods to weed your garden isn't, especially soon after eating.   . Don't lie down after a meal. Wait at least three to four hours after eating before going to bed, and don't lie down right after eating.   Alternative medicine . Several home remedies exist for treating GERD, but they provide only temporary relief. They include  drinking baking soda (sodium bicarbonate) added to water or drinking other fluids such as baking soda mixed with cream of tartar and water. . Although these liquids create temporary relief by neutralizing, washing away or buffering acids, eventually they aggravate the situation by adding gas and fluid to your stomach, increasing pressure and causing more acid reflux. Further, adding more sodium to your diet may increase your blood pressure and add stress to your heart, and excessive bicarbonate ingestion can alter the acid-base balance in your body.

## 2018-04-23 NOTE — Progress Notes (Signed)
ON RECALL  °

## 2018-04-23 NOTE — Assessment & Plan Note (Signed)
SYMPTOMS NOT IDEALLY CONTROLLED. DIFFERENTIAL DIAGNOSIS INCLUDES: H PYLORI GASTRITIS, UNCONTROLLED GERD, IBS EXACERBATED BY TOBACCO USE, LESS LIKELY GE JUNCTION TUMOR, GASTRIC OR PANCREATIC CA OR CHRONIC MESENTERIC ISCHEMIA.  DRINK WATER TO KEEP YOUR URINE LIGHT YELLOW. AVOID TRIGGERS FOR REFLUX.  HANDOUT GIVEN. FOLLOW A LOW FAT DIET. MEATS SHOULD BE BAKED, BROILED, OR BOILED. Avoid fried foods.  HANDOUT GIVEN. CHEW ONE TUMS first thing in the morning TO reduce DIARRHEA. ADD DICYCLOMINE 30 MINUTES PRIOR TO FIRST MEAL. IT MAY CAUSE DROWSINESS, DRY EYES/MOUTH, BLURRY VISION, OR DIFFICULTY URINATING. START OMEPRAZOLE.  TAKE 30 MINUTES PRIOR TO YOUR FIRST MEAL. COMPLETE COLONOSCOPY AND UPPER ENDOSCOPY IN 4-6 WEEKS. YOU MAY BRING THE ENEMA TO ADMINISTER IN THE PREOP AREA. DISCUSSED PROCEDURE, BENEFITS, & RISKS: < 1% chance of medication reaction, bleeding, perforation, or rupture of spleen/liver.  FOLLOW UP IN 6 MOS.

## 2018-04-23 NOTE — Progress Notes (Signed)
Subjective:    Patient ID: Angela Roth, female    DOB: 1960-07-29, 58 y.o.   MRN: [id number]  Joette Catching, MD  HPI Last TCS 2010: lymphocytic colitiis. rx & got better and now symptoms worse over past 3 years. Mother passed JUN 2019 AS WELL WITH STOMACH CANCER/HAD POLYPS.  CHANGE IN BOWEL IN HABITS: BMs: GOOD DAY IS 10 AND BAD DAY A WHOLE LOT MORE(DIARRHEA/MUCOUS, NL FORMED STOOL: ONCE A MONTH). DAIRY: MILK IN COFFEE, ICE CREAM; NO, CHEESE: NONE DUE TO CONSTIPATION(STOOL FIRM/NL AND HARD TO PASS). NAUSEATED WHEN SHE CAN'T OVER HER BOWELS AND AT OTHER TIMES. ASA 81 MG DAILY. NO ETOH BECAUSE SHE HAS GERD(FEELS LIKE A HEART ATTACK). GB CAME OUT DUE TO GALLSTONES. HEARTBURN: RAN OUT DIDN'T GET FILLED, SYMPTOMS NOT CONTROLLED. RARE VOMITING(NO BLOOD). ABDOMINAL PAIN: CRAMPY, DULL ACHE, GAS TRAPPED(PRESSURE), WORSE WITH CONSTIPATION, UPEER TO LOWER/LEFT TO RIGHT AND SOMETIMES FROM TO BACK, TRIGGERS: ??, SYMPTOMS x EVERY DAY FOR YEARS BUT WORSE OVER PAST 2-3 YEARS.  PT DENIES FEVER, CHILLS, HEMATOCHEZIA, HEMATEMESIS,  melena, CHEST PAIN, SHORTNESS OF BREATH,  problems swallowing, OR problems with sedation.  Past Medical History:  Diagnosis Date  . Arthritis   . CAD (coronary artery disease)    a. inferior STEMI 10/2015 s/p DESx3 to RCA.  Marland Kitchen Chronic headaches   . Depression   . Essential hypertension   . Fibromyalgia   . GERD (gastroesophageal reflux disease)   . History of ST elevation myocardial infarction (STEMI)   . Hyperlipidemia   . Hypothyroidism   . Irritable bowel syndrome (IBS) AGE 62   DIARRHEA PREDOMINANT  . LVF (left ventricular failure) (HCC)    a. Transient LV failure during STEMI 10/2015 - EF 45-50% with f/u EF 55% by echo same adm.  . Lymphocytic colitis 2010  . Tobacco abuse    Past Surgical History:  Procedure Laterality Date  . BACK SURGERY    . CARDIAC CATHETERIZATION N/A 11/18/2015   Procedure: Left Heart Cath and Coronary Angiography;  Surgeon: Lyn Records,  MD;  Location: [facility] INVASIVE CV LAB;  Service: Cardiovascular;  Laterality: N/A;  . CARDIAC CATHETERIZATION N/A 11/18/2015   Procedure: Coronary Stent Intervention;  Surgeon: Lyn Records, MD;  Location: [facility] INVASIVE CV LAB;  Service: Cardiovascular;  Laterality: N/A;  . CARDIAC CATHETERIZATION N/A 02/01/2016   Procedure: Left Heart Cath and Coronary Angiography;  Surgeon: Lyn Records, MD;  Location: [facility] INVASIVE CV LAB;  Service: Cardiovascular;  Laterality: N/A;  . CARDIAC CATHETERIZATION N/A 02/01/2016   Procedure: Intravascular Pressure Wire/FFR Study;  Surgeon: Lyn Records, MD;  Location: [facility] INVASIVE CV LAB;  Service: Cardiovascular;  Laterality: N/A;  . CHOLECYSTECTOMY    . TOTAL ABDOMINAL HYSTERECTOMY W/ BILATERAL SALPINGOOPHORECTOMY      Allergies  Allergen Reactions  . Indomethacin Other (See Comments)    Patient has bad side effects "suicidal thoughts/homicidal thoughts"   Current Outpatient Medications  Medication Sig Dispense Refill  . acetaminophen (TYLENOL) 500 MG tablet Take 1,000 mg by mouth every 6 (six) hours as needed for headache.    Marland Kitchen alum & mag hydroxide-simeth (MAALOX/MYLANTA) 200-200-20 MG/5ML suspension Take 30 mLs by mouth every 6 (six) hours as needed for indigestion or heartburn.    Marland Kitchen aspirin EC 81 MG tablet Take 1 tablet (81 mg total) by mouth daily.    . carvedilol (COREG) 12.5 MG tablet Take 1 tablet (12.5 mg total) by mouth 2 (two) times daily. (Patient taking differently: Take 12.5 mg by mouth  daily. ) 180 tablet 3  . Cholecalciferol (VITAMIN D3) 50 MCG (2000 UT) capsule Take 1 capsule by mouth daily.    . clopidogrel (PLAVIX) 75 MG tablet Take 1 tablet (75 mg total) by mouth daily.    . cyclobenzaprine (FLEXERIL) 10 MG tablet Take 1 tablet (10 mg total) by mouth 3 (three) times daily as needed for muscle spasms.    . hydrOXYzine (VISTARIL) 25 MG capsule Take 1 capsule by mouth 3 (three) times daily as needed. FOR ANXIETY   . lisinopril (PRINIVIL,ZESTRIL) 10  MG tablet Take 1 tablet by mouth daily.    . Melatonin 3 MG TABS Take 3 mg by mouth at bedtime as needed (sleep).     . methocarbamol (ROBAXIN) 500 MG tablet Take 1 tablet by mouth daily.    . nitroGLYCERIN (NITROSTAT) 0.4 MG SL tablet Place 1 tablet (0.4 mg total) under the tongue every 5 (five) minutes as needed for chest pain. 25 tablet 3  .    3  .    0  .      Marland Kitchen St Johns Wort 300 MG CAPS Take 300 mg by mouth daily as needed (mood support).     Family History  Problem Relation Age of Onset  . Lung cancer Maternal Grandmother   . Ovarian cancer Maternal Grandmother   . Asthma Brother   . Stomach cancer Mother   . Colon polyps Mother   . Colon cancer Neg Hx     Social History   Socioeconomic History  . Marital status: Divorced    Spouse name: Not on file  . Number of children: Not on file  . Years of education: Not on file  . Highest education level: Not on file  Occupational History  . Not on file  Social Needs  . Financial resource strain: Not on file  . Food insecurity:    Worry: Not on file    Inability: Not on file  . Transportation needs:    Medical: Not on file    Non-medical: Not on file  Tobacco Use  . Smoking status: Current Every Day Smoker    Packs/day: 1.00    Years: 42.00    Pack years: 42.00    Types: Cigarettes    Start date: 09/12/1971  . Smokeless tobacco: Never Used  Substance and Sexual Activity  . Alcohol use: No    Alcohol/week: 0.0 standard drinks  . Drug use: No  . Sexual activity: Yes    Partners: Male  Lifestyle  . Physical activity:    Days per week: Not on file    Minutes per session: Not on file  . Stress: Not on file  Relationships  . Social connections:    Talks on phone: Not on file    Gets together: Not on file    Attends religious service: Not on file    Active member of club or organization: Not on file    Attends meetings of clubs or organizations: Not on file    Relationship status: Not on file  Other Topics Concern   . Not on file  Social History Narrative   WAS A MED [employer] IN HOSPITAL. SERVED FOR ALMOST 13 YRS. GOT OUT BECAUSE SHE INJURED HER BACK. THREE KIDS AND MARRIED. SPENDS FREE TIME:  NOTHING BECAUSE HAD CATARACTS AND WAS BLIND. IS A HOME BODY DUE TO PTSD/ANXIETY AND BOWELS.         ORIGINALLY FROM [place]: [place]. BEEN IN [place] FOR 10  YRS.   Review of Systems PER HPI OTHERWISE ALL SYSTEMS ARE NEGATIVE.    Objective:   Physical Exam Vitals signs reviewed.  Constitutional:      General: She is not in acute distress.    Appearance: She is well-developed.  HENT:     Head: Normocephalic and atraumatic.     Mouth/Throat:     Pharynx: No oropharyngeal exudate.  Eyes:     General: No scleral icterus.    Pupils: Pupils are equal, round, and reactive to light.  Neck:     Musculoskeletal: Normal range of motion and neck supple.  Cardiovascular:     Rate and Rhythm: Normal rate and regular rhythm.     Heart sounds: Normal heart sounds.  Pulmonary:     Effort: Pulmonary effort is normal. No respiratory distress.     Breath sounds: Normal breath sounds.  Abdominal:     General: Bowel sounds are normal. There is no distension.     Palpations: Abdomen is soft.     Tenderness: There is no abdominal tenderness.  Lymphadenopathy:     Cervical: No cervical adenopathy.  Neurological:     Mental Status: She is alert and oriented to person, place, and time. Mental status is at baseline.     Comments: NO  NEW FOCAL DEFICITS   Psychiatric:        Behavior: Behavior normal.        Thought Content: Thought content normal.     Comments: FLAT AFFECT, SLIGHTLY ANXIOUS MOOD       Assessment & Plan:

## 2018-04-23 NOTE — Assessment & Plan Note (Signed)
SYMPTOMS NOT IDEALLY CONTROLLED AND MOST LIKELY DUE TO IBS-D EXACERBATED BY BILE SALT DIARRHEA/DAIRY. DIFFERENTIAL DIAGNOSIS INCLUDES: CELIAC SPRUE, FLARE OF LYMPHOYTIC COLITIS. LESS LIKELY THYROID DISTURBANCE, GIARDIASIS, C DIFF COLITIS, OR IBD.  DRINK WATER TO KEEP YOUR URINE LIGHT YELLOW. AVOID TRIGGERS FOR REFLUX.  HANDOUT GIVEN. FOLLOW A LOW FAT DIET. MEATS SHOULD BE BAKED, BROILED, OR BOILED. Avoid fried foods.  HANDOUT GIVEN. CHEW ONE TUMS first thing in the morning TO reduce DIARRHEA. ADD DICYCLOMINE 30 MINUTES PRIOR TO FIRST MEAL. IT MAY CAUSE DROWSINESS, DRY EYES/MOUTH, BLURRY VISION, OR DIFFICULTY URINATING. COMPLETE COLONOSCOPY AND UPPER ENDOSCOPY  TO BIOPSYS GASTRIC/DUODENAL MUCOSA IN 4-6 WEEKS. YOU MAY BRING THE ENEMA TO ADMINISTER IN THE PREOP AREA. DISCUSSED PROCEDURE, BENEFITS, & RISKS: < 1% chance of medication reaction, bleeding, perforation, or rupture of spleen/liver.  FOLLOW UP IN 6 MOS.

## 2018-04-23 NOTE — Telephone Encounter (Signed)
Called and informed pt of pre-op appt 07/01/18 at 12:45pm. Letter mailed.

## 2018-04-23 NOTE — Progress Notes (Signed)
cc'd to pcp 

## 2018-06-30 ENCOUNTER — Encounter (HOSPITAL_COMMUNITY): Payer: Self-pay

## 2018-07-01 ENCOUNTER — Encounter (HOSPITAL_COMMUNITY)
Admission: RE | Admit: 2018-07-01 | Discharge: 2018-07-01 | Disposition: A | Discharge status: Home / Self Care | Payer: Non-veteran care | Source: Ambulatory Visit | Attending: Gastroenterology | Admitting: Gastroenterology

## 2018-07-06 ENCOUNTER — Telehealth: Payer: Self-pay | Admitting: Gastroenterology

## 2018-07-06 NOTE — Telephone Encounter (Signed)
Called pt, TCS/EGD w/Propofol w/SLF rescheduled to 09/10/18 at 1:45pm. Informed endo scheduler. Endo scheduler scheduled pre-op phone call 09/04/18. Called pt back and informed her of pre-op phone call. New procedure instructions mailed.

## 2018-07-06 NOTE — Telephone Encounter (Signed)
Darl Pikes, pt's [facility] auth expires 08/05/18. She rescheduled procedure to 09/10/18. Please obtain updated auth to cover new procedure date.

## 2018-07-06 NOTE — Telephone Encounter (Signed)
Pt called to say that she is having car trouble and with the Corona Virus she wants to just reschedule her procedure with [facility] for tomorrow to one day next month. Please advise and call [phone]

## 2018-07-07 NOTE — Telephone Encounter (Signed)
VA auth from [employer] is scanned into documents and media. Valid dates are from 04/23/2018 to 10/20/2018 #[id number]

## 2018-09-01 ENCOUNTER — Telehealth: Payer: Self-pay | Admitting: *Deleted

## 2018-09-01 NOTE — Telephone Encounter (Signed)
Spoke with patient and she is aware she will be receiving a call from endo to arrange COVID-19 Testing and she will need to remain quarantined after the test until after her procedure. She is aware if this is not done then is procedure will be cancelled. She voiced understanding. 

## 2018-09-02 NOTE — Telephone Encounter (Signed)
Endo scheduler called office, she called pt to schedule COVID-19 test. Pt wants to wait till all of this is over to reschedule procedure. She told endo scheduler she will call office to reschedule procedure. Will await call from pt.  FYI to [facility].

## 2018-09-04 ENCOUNTER — Inpatient Hospital Stay (HOSPITAL_COMMUNITY): Admission: RE | Admit: 2018-09-04 | Payer: Non-veteran care | Source: Ambulatory Visit

## 2018-09-07 ENCOUNTER — Telehealth: Payer: Self-pay

## 2018-09-07 NOTE — Telephone Encounter (Signed)
Called and informed pt we will contact her to reschedule procedure when [facility]F's August schedule is available (no openings for July at this time).

## 2018-09-07 NOTE — Telephone Encounter (Signed)
Pt would like to r/s her procedure scheduled with [facility] on 09/10/18. She would prefer to have her procedure done in July 2020.

## 2018-09-08 NOTE — Telephone Encounter (Signed)
REVIEWED. AGREE. NO ADDITIONAL RECOMMENDATIONS. 

## 2018-09-10 ENCOUNTER — Encounter (HOSPITAL_COMMUNITY): Admission: RE | Payer: Self-pay | Source: Home / Self Care

## 2018-09-10 ENCOUNTER — Ambulatory Visit (HOSPITAL_COMMUNITY): Admission: RE | Admit: 2018-09-10 | Payer: Non-veteran care | Source: Home / Self Care | Admitting: Gastroenterology

## 2018-09-10 SURGERY — COLONOSCOPY WITH PROPOFOL
Anesthesia: Monitor Anesthesia Care

## 2018-09-17 ENCOUNTER — Telehealth: Payer: Self-pay | Admitting: *Deleted

## 2018-09-17 NOTE — Telephone Encounter (Signed)
LMOVM for pt to call back to schedule TCS/EGD W/ PROPOFOL-SLF.  Also patient will need an updated referral the [facility] as her will expire on 10/20/2018 and procedure will be booked after this date. thanks

## 2018-09-20 IMAGING — MG DIGITAL SCREENING BILATERAL MAMMOGRAM WITH TOMO AND CAD
8 series · 8 of 24 positions shown · non-contrast
Comparison: Previous exam(s).

CLINICAL DATA: Screening.

EXAM:
DIGITAL SCREENING BILATERAL MAMMOGRAM WITH TOMO AND CAD

[R CC synth-2D]
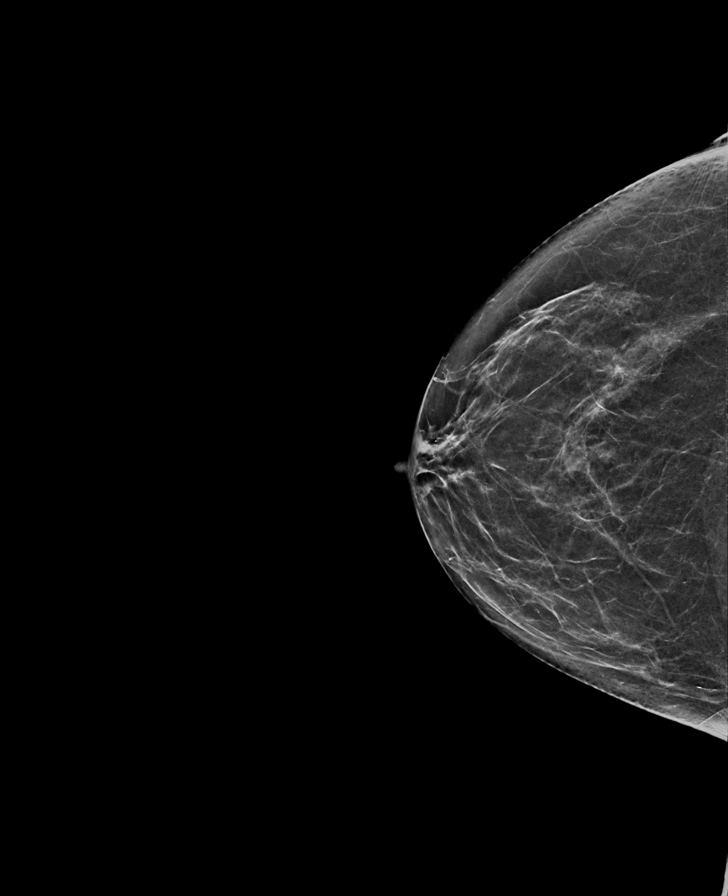

[R MLO synth-2D]
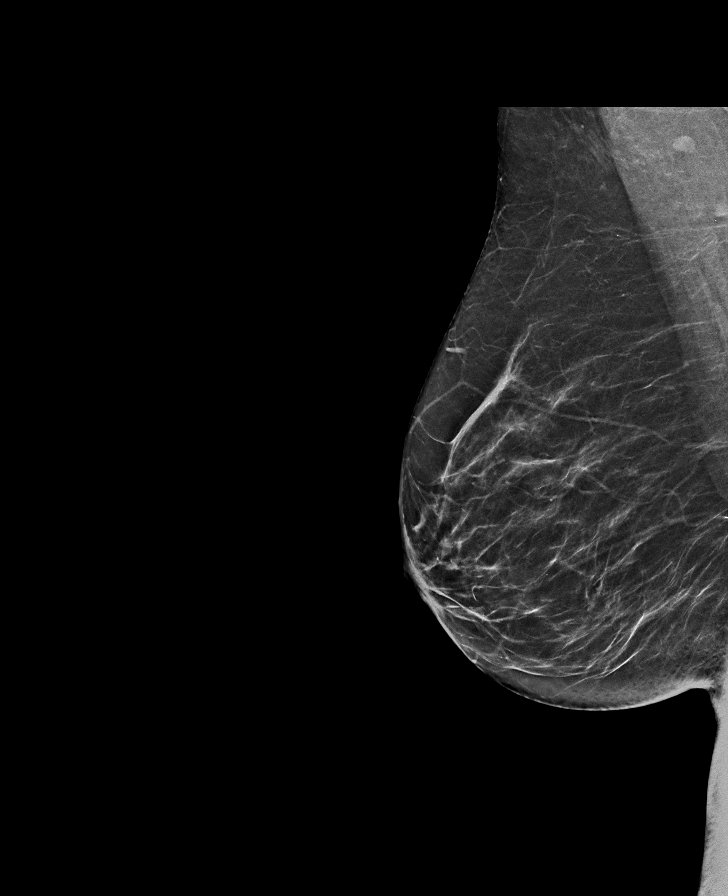

[L CC synth-2D]
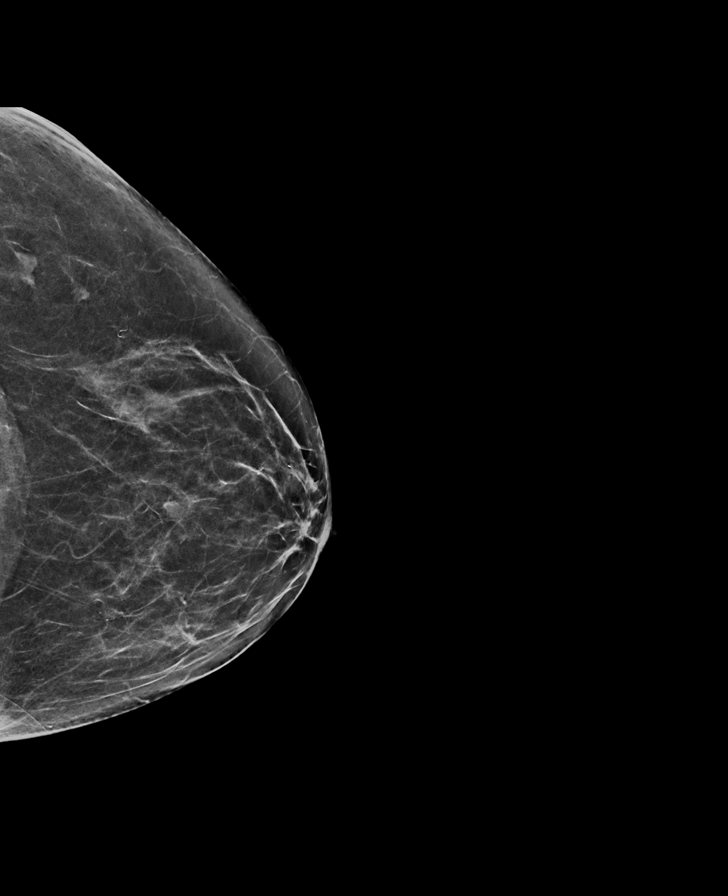

[L MLO synth-2D]
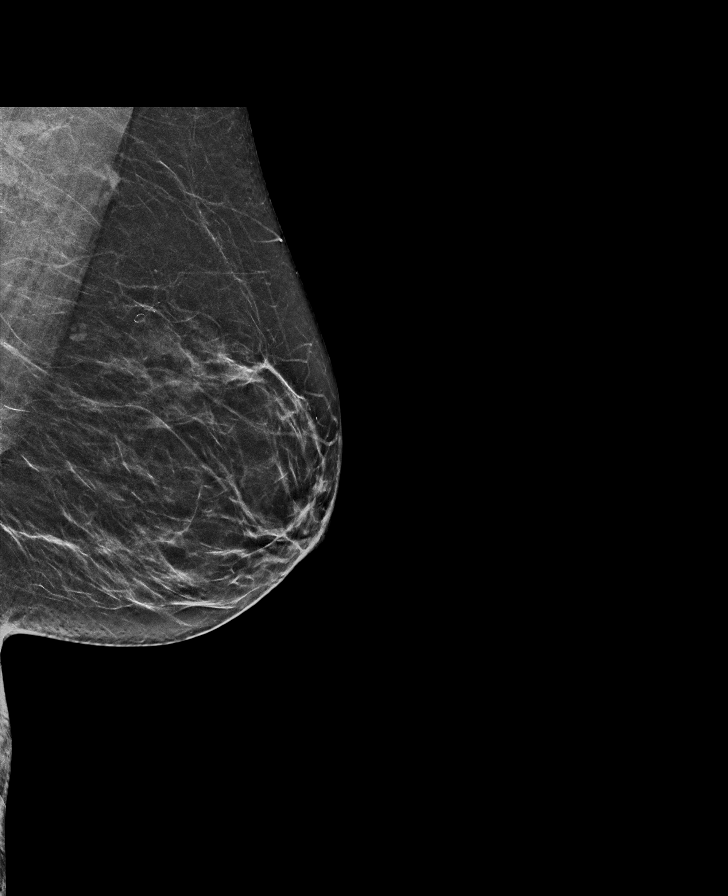

[R CC tomo · tomo slice 28/55.0]
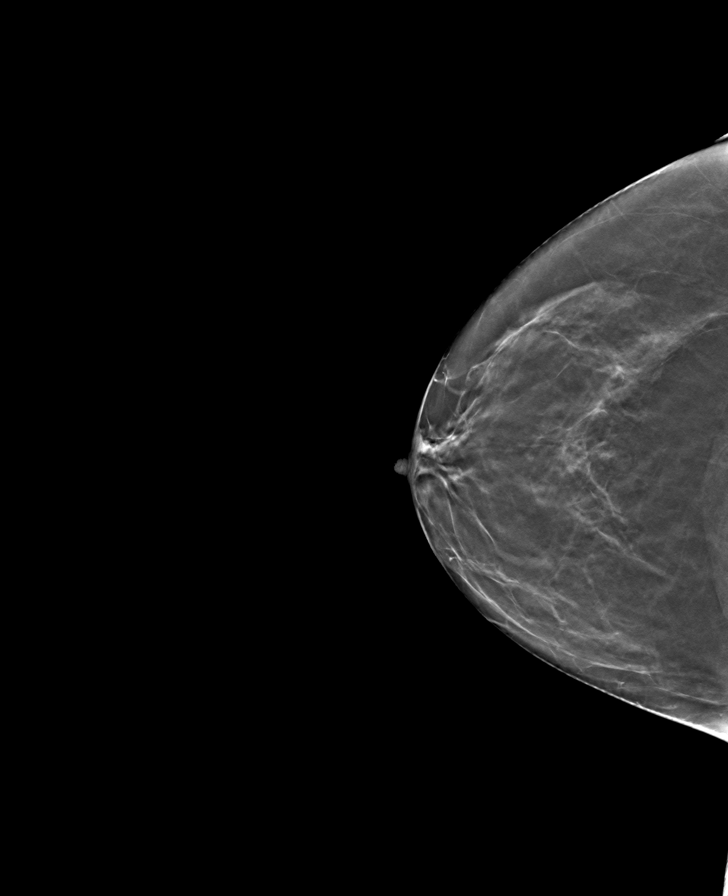

[L MLO tomo · tomo slice 29/57.0]
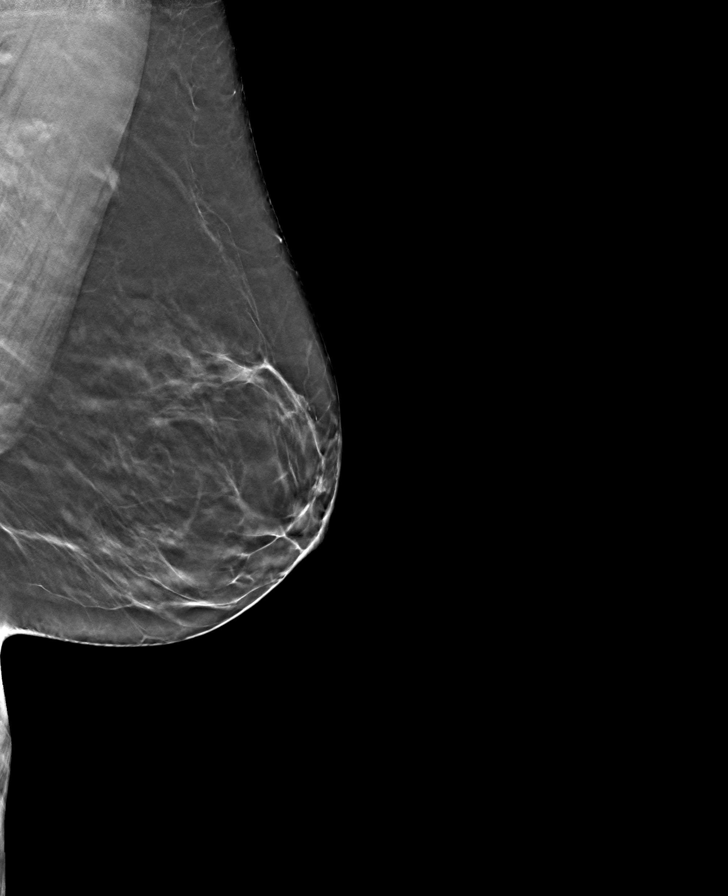

[L CC tomo · tomo slice 30/59.0]
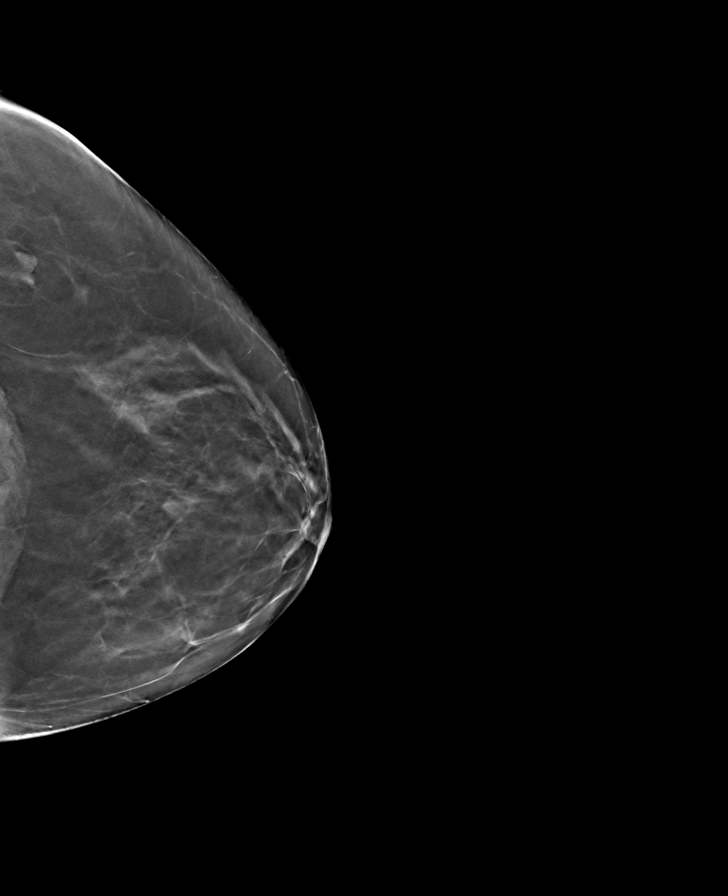

[R MLO tomo · tomo slice 29/58.0]
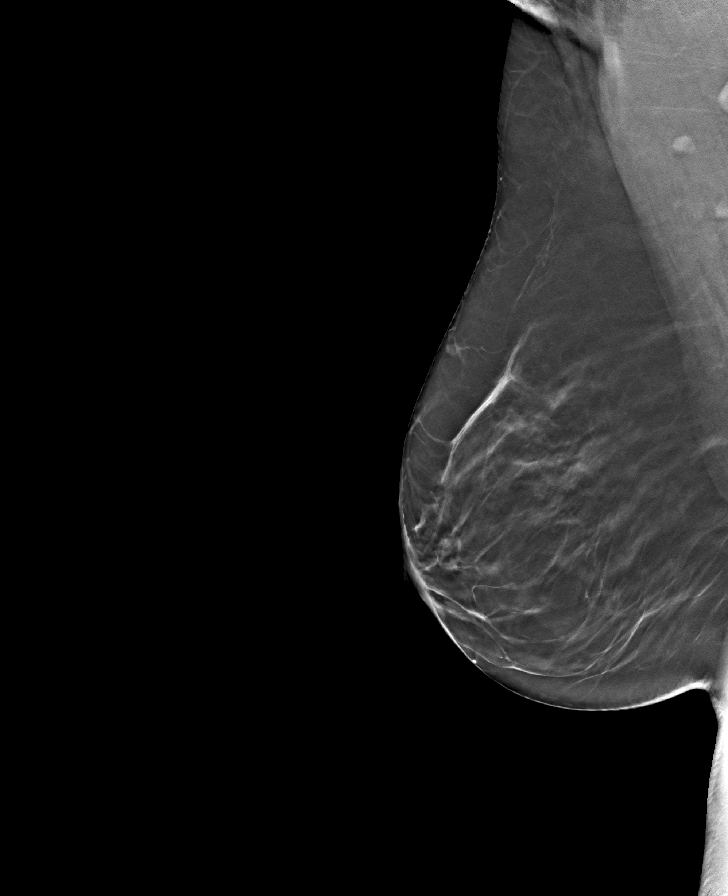

[8 of 24 positions shown; findings below may reference images not displayed]

ACR Breast Density Category b: There are scattered areas of
fibroglandular density.
FINDINGS: There are no findings suspicious for malignancy. Images were
processed with CAD.
IMPRESSION: No mammographic evidence of malignancy. A result letter of this
screening mammogram will be mailed directly to the patient.

RECOMMENDATION:
Screening mammogram in one year. (Code:[id number])

BI-RADS CATEGORY  1: Negative.

## 2018-09-21 NOTE — Telephone Encounter (Signed)
Letter mailed to call back to schedule 

## 2018-09-22 NOTE — Telephone Encounter (Signed)
Requested update VA Auth.

## 2018-09-28 ENCOUNTER — Encounter: Payer: Self-pay | Admitting: Gastroenterology

## 2019-01-13 ENCOUNTER — Telehealth: Payer: Self-pay | Admitting: Cardiology

## 2019-01-13 NOTE — Telephone Encounter (Signed)
FYI.  °Tried contacting patient numerous times to schedule appointment from recall list. °Deleted recall from system. °

## 2019-12-31 ENCOUNTER — Ambulatory Visit (INDEPENDENT_AMBULATORY_CARE_PROVIDER_SITE_OTHER): Payer: Medicare Other | Admitting: Nurse Practitioner

## 2019-12-31 ENCOUNTER — Other Ambulatory Visit: Payer: Self-pay

## 2019-12-31 ENCOUNTER — Encounter: Payer: Self-pay | Admitting: Nurse Practitioner

## 2019-12-31 VITALS — BP 134/80 | HR 66 | Temp 97.7°F | Resp 20 | Ht 62.5 in | Wt 153.0 lb

## 2019-12-31 DIAGNOSIS — I10 Essential (primary) hypertension: Secondary | ICD-10-CM | POA: Diagnosis not present

## 2019-12-31 DIAGNOSIS — Z0001 Encounter for general adult medical examination with abnormal findings: Secondary | ICD-10-CM

## 2019-12-31 DIAGNOSIS — R7989 Other specified abnormal findings of blood chemistry: Secondary | ICD-10-CM | POA: Diagnosis not present

## 2019-12-31 DIAGNOSIS — I501 Left ventricular failure: Secondary | ICD-10-CM | POA: Diagnosis not present

## 2019-12-31 DIAGNOSIS — Z0000 Encounter for general adult medical examination without abnormal findings: Secondary | ICD-10-CM | POA: Insufficient documentation

## 2019-12-31 DIAGNOSIS — E785 Hyperlipidemia, unspecified: Secondary | ICD-10-CM | POA: Diagnosis not present

## 2019-12-31 DIAGNOSIS — I4891 Unspecified atrial fibrillation: Secondary | ICD-10-CM | POA: Diagnosis not present

## 2019-12-31 LAB — BAYER DCA HB A1C WAIVED: HB A1C (BAYER DCA - WAIVED): 5.8 % (ref <7.0)

## 2019-12-31 NOTE — Assessment & Plan Note (Signed)
Patient is a 59 year old female who presents to clinic for establishing care and chronic disease management. Concerning patient's hypertension, patient tolerates Zestril 10 mg by mouth daily with no side effects. Patient's blood pressure is within normal limits for patient with systolic 134 over diastolic 80. Compliance with treatment has been good. Patient takes medication as prescribed and follows up as directed. Continue to eat a low-sodium healthy diet and incorporate exercise.  Labs completed  Patient knows to follow-up with worsening or or elevated blood pressures.

## 2019-12-31 NOTE — Progress Notes (Signed)
New Patient Office Visit  Subjective:  Patient ID: Angela Roth, female    DOB: [birth year]  Age: 59 y.o. MRN: [id number]  CC:  Chief Complaint  Patient presents with   Establish Care    NEW     HPI Angela Roth  .   Encounter for general adult medical examination without abnormal findings  Physical : Patient's last physical exam was 1 year ago .  Weight: Not appropriate for height (BMI greater than 27%) ; 27.54kg/m Blood Pressure: Normal (BP less than 120/80) ;   Medical History: Patient history reviewed ; Family history reviewed ;  Allergies Reviewed: No change in current allergies ;  Medications Reviewed: Medications reviewed - no changes ;  Lipids: Normal lipid levels ;  Smoking: Life-long -smoker ;  Physical Activity: Exercises at least 3 times per week ; No Alcohol/Drug Use: Is a non-drinker ; illicit drug use ; Marijuana from the age if 25 Patient is afflicted from Stress Incontinence and Urge Incontinence  Safety: reviewed ; Patient wears a seat belt, has smoke detectors, has carbon monoxide detectors, practices appropriate gun safety, and wears sunscreen with extended sun exposure. Dental Care: biannual cleanings: No, brushes and flosses daily: yes Ophthalmology/Optometry: Annual visit. : no, pt had cataract surgery in the past Hearing loss: intermittent Vision impairments: cataract surgery in the past  Past Medical History:  Diagnosis Date   Arthritis    CAD (coronary artery disease)    a. inferior STEMI 10/2015 s/p DESx3 to RCA.   Chronic headaches    Chronic sinusitis    COPD (chronic obstructive pulmonary disease) (HCC)    Depression    Essential hypertension    Fibromyalgia    GERD (gastroesophageal reflux disease)    History of ST elevation myocardial infarction (STEMI)    Hyperlipidemia    Hypothyroidism    Irritable bowel syndrome (IBS) AGE 51   DIARRHEA PREDOMINANT   LVF (left ventricular failure) ([place])    a. Transient LV failure  during STEMI 10/2015 - EF 45-50% with f/u EF 55% by echo same adm.   Lymphocytic colitis 2010   Tobacco abuse     Past Surgical History:  Procedure Laterality Date   BACK SURGERY     CARDIAC CATHETERIZATION N/A 11/18/2015   Procedure: Left Heart Cath and Coronary Angiography;  Surgeon: Belva Crome, MD;  Location: [facility] CV LAB;  Service: Cardiovascular;  Laterality: N/A;   CARDIAC CATHETERIZATION N/A 11/18/2015   Procedure: Coronary Stent Intervention;  Surgeon: Belva Crome, MD;  Location: [place] CV LAB;  Service: Cardiovascular;  Laterality: N/A;   CARDIAC CATHETERIZATION N/A 02/01/2016   Procedure: Left Heart Cath and Coronary Angiography;  Surgeon: Belva Crome, MD;  Location: [place] CV LAB;  Service: Cardiovascular;  Laterality: N/A;   CARDIAC CATHETERIZATION N/A 02/01/2016   Procedure: Intravascular Pressure Wire/FFR Study;  Surgeon: Belva Crome, MD;  Location: [place] CV LAB;  Service: Cardiovascular;  Laterality: N/A;   CESAREAN SECTION     CHOLECYSTECTOMY     EYE SURGERY     FOOT SURGERY     left fooot = x3    TOTAL ABDOMINAL HYSTERECTOMY W/ BILATERAL SALPINGOOPHORECTOMY      Family History  Problem Relation Age of Onset   Lung cancer Maternal Grandmother    Ovarian cancer Maternal Grandmother    Asthma Brother    Hypertension Brother    Stomach cancer Mother    Colon polyps Mother    Cancer Mother  non hodg. lymphoma    Cancer Father        lung cancer / black lung    Suicidality Father    Thyroid disease Sister    Irritable bowel syndrome Son    Colon cancer Neg Hx     Social History   Socioeconomic History   Marital status: Married    Spouse name: Gwyndolyn Saxon    Number of children: 3   Years of education: Not on file   Highest education level: Not on file  Occupational History   Occupation: disabled   Tobacco Use   Smoking status: Current Every Day Smoker    Packs/day: 1.00    Years: 42.00     Pack years: 42.00    Types: Cigarettes    Start date: 09/12/1971   Smokeless tobacco: Never Used  Vaping Use   Vaping Use: Never used  Substance and Sexual Activity   Alcohol use: No    Alcohol/week: 0.0 standard drinks   Drug use: No   Sexual activity: Yes    Partners: Male  Other Topics Concern   Not on file  Social History Narrative   WAS [employer]. SERVED FOR ALMOST 13 YRS. GOT OUT BECAUSE SHE INJURED HER BACK. THREE KIDS AND MARRIED. SPENDS FREE TIME:  NOTHING BECAUSE HAD CATARACTS AND WAS BLIND. IS A HOME BODY DUE TO PTSD/ANXIETY AND BOWELS. MARRIED FOR PAST 5-6 YRS. DIDN'T WANT TO GROW OLD ALONE AND NEEDED FATHER FOR HER SON & HAS 2 CATS, 3 DOGS.         [place]: [place]. BEEN IN [place] FOR 10 YRS.   Social Determinants of Health   Financial Resource Strain:      Food Insecurity:         Transportation Needs:         Physical Activity:         Stress:      Social Connections:                     Intimate Partner Violence:                 ROS Review of Systems  Endocrine: Positive for cold intolerance.  Neurological: Positive for weakness.  Psychiatric/Behavioral: The patient is nervous/anxious.   All other systems reviewed and are negative.   Objective:   Today's Vitals: BP 134/80    Pulse 66    Temp 97.7 F (36.5 C)    Resp 20    Ht 5' 2.5" (1.588 m)    Wt 153 lb (69.4 kg)    SpO2 98%    BMI 27.54 kg/m   Physical Exam Constitutional:      Appearance: Normal appearance.  HENT:     Head: Normocephalic.     Right Ear: External ear normal. There is impacted cerumen.     Left Ear: External ear normal. There is no impacted cerumen.     Mouth/Throat:     Mouth: Mucous membranes are moist.     Pharynx: Oropharynx is clear.  Eyes:     Conjunctiva/sclera: Conjunctivae normal.  Cardiovascular:     Rate and Rhythm: Normal rate and regular rhythm.     Pulses: Normal pulses.     Heart sounds:  Normal heart sounds.  Pulmonary:     Effort: Pulmonary effort is normal.     Breath sounds: Normal breath sounds.  Abdominal:     General: Bowel sounds are normal.  Musculoskeletal:        General: Tenderness present.     Cervical back: Normal range of motion.  Skin:    General: Skin is warm.  Neurological:     Mental Status: She is alert and oriented to person, place, and time.  Psychiatric:        Mood and Affect: Mood normal.     Assessment & Plan:   Problem List Items Addressed This Visit      Cardiovascular and Mediastinum   Essential hypertension    Patient is a 59 year old female who presents to clinic for establishing care and chronic disease management. Concerning patient's hypertension, patient tolerates Zestril 10 mg by mouth daily with no side effects. Patient's blood pressure is within normal limits for patient with systolic 960 over diastolic 80. Compliance with treatment has been good. Patient takes medication as prescribed and follows up as directed. Continue to eat a low-sodium healthy diet and incorporate exercise.  Labs completed  Patient knows to follow-up with worsening or or elevated blood pressures.      LVF (left ventricular failure) ([facility])   Relevant Orders   For home use only DME Other see comment     Other   Annual physical exam - Primary    Patient is a 59 year old female who presents to clinic today for an annual physical exam and establishing care. Patient is reporting increased weakness, and low energy. Patient reports weakness is greater after shower and will need a shower chair. Completed head to toe assessment with printed handouts given for health maintenance education. Labs completed today, CMP, TSH, lipid panel, and hemoglobin A1c. Shower chair ordered. Advised patient on smoking cessation, patient is not ready to start quitting.         Relevant Orders   TSH   BMP8+EGFR   Lipid panel   Hepatic function panel   Bayer DCA Hb A1c  Waived   Urinalysis, Complete    Other Visit Diagnoses    Atrial fibrillation, unspecified type ([facility])   (Chronic)        Outpatient Encounter Medications as of 12/31/2019  Medication Sig   acetaminophen (TYLENOL) 500 MG tablet Take 1,000 mg by mouth every 6 (six) hours as needed for headache.   albuterol (VENTOLIN HFA) 108 (90 Base) MCG/ACT inhaler INHALE TWO PUFFS BY MOUTH EVERY 4 TO 6 HOURS AS NEEDED FOR WHEEZING   alum & mag hydroxide-simeth (MAALOX/MYLANTA) 200-200-20 MG/5ML suspension Take 30 mLs by mouth every 6 (six) hours as needed for indigestion or heartburn.   aspirin EC 81 MG tablet Take 1 tablet (81 mg total) by mouth daily.   carvedilol (COREG) 25 MG tablet Take 25 mg by mouth daily.   diclofenac Sodium (VOLTAREN) 1 % GEL Apply topically 4 (four) times daily.   dicyclomine (BENTYL) 10 MG capsule 1 PO 30 MINUTES PRIOR TO FIRST MEAL (Patient taking differently: Take 10 mg by mouth daily before breakfast. 1 PO 30 MINUTES PRIOR TO FIRST MEAL)   fluticasone (FLONASE) 50 MCG/ACT nasal spray Place into both nostrils daily.   hydrOXYzine (VISTARIL) 25 MG capsule Take 25 mg by mouth 3 (three) times daily as needed for anxiety.    lisinopril (PRINIVIL,ZESTRIL) 10 MG tablet Take 10 mg by mouth daily.    LORazepam (ATIVAN) 1 MG tablet Take 1 tablet (1 mg total) by mouth every 6 (six) hours as needed for anxiety. (Patient taking differently: Take 1 mg by mouth every 8 (eight) hours as needed for anxiety. )  Melatonin 3 MG TABS Take 3 mg by mouth at bedtime as needed (sleep).    methocarbamol (ROBAXIN) 500 MG tablet Take 500 mg by mouth at bedtime.    nitroGLYCERIN (NITROSTAT) 0.4 MG SL tablet Place 1 tablet (0.4 mg total) under the tongue every 5 (five) minutes as needed for chest pain. (Patient not taking: Reported on 12/31/2019)   [DISCONTINUED] atorvastatin (LIPITOR) 80 MG tablet Take 1 tablet (80 mg total) by mouth daily at 6 PM.   [DISCONTINUED] carvedilol (COREG) 12.5  MG tablet Take 1 tablet (12.5 mg total) by mouth 2 (two) times daily. (Patient not taking: Reported on 06/25/2018)   [DISCONTINUED] clopidogrel (PLAVIX) 75 MG tablet Take 1 tablet (75 mg total) by mouth daily.   [DISCONTINUED] cyclobenzaprine (FLEXERIL) 10 MG tablet Take 1 tablet (10 mg total) by mouth 3 (three) times daily as needed for muscle spasms. (Patient not taking: Reported on 06/25/2018)   No facility-administered encounter medications on file as of 12/31/2019.    Follow-up: No follow-ups on file.   Angela Lynn, NP

## 2019-12-31 NOTE — Patient Instructions (Signed)
Health Maintenance, Female Adopting a healthy lifestyle and getting preventive care are important in promoting health and wellness. Ask your health care provider about:  The right schedule for you to have regular tests and exams.  Things you can do on your own to prevent diseases and keep yourself healthy. What should I know about diet, weight, and exercise? Eat a healthy diet   Eat a diet that includes plenty of vegetables, fruits, low-fat dairy products, and lean protein.  Do not eat a lot of foods that are high in solid fats, added sugars, or sodium. Maintain a healthy weight Body mass index (BMI) is used to identify weight problems. It estimates body fat based on height and weight. Your health care provider can help determine your BMI and help you achieve or maintain a healthy weight. Get regular exercise Get regular exercise. This is one of the most important things you can do for your health. Most adults should:  Exercise for at least 150 minutes each week. The exercise should increase your heart rate and make you sweat (moderate-intensity exercise).  Do strengthening exercises at least twice a week. This is in addition to the moderate-intensity exercise.  Spend less time sitting. Even light physical activity can be beneficial. Watch cholesterol and blood lipids Have your blood tested for lipids and cholesterol at 60 years of age, then have this test every 5 years. Have your cholesterol levels checked more often if:  Your lipid or cholesterol levels are high.  You are older than 60 years of age.  You are at high risk for heart disease. What should I know about cancer screening? Depending on your health history and family history, you may need to have cancer screening at various ages. This may include screening for:  Breast cancer.  Cervical cancer.  Colorectal cancer.  Skin cancer.  Lung cancer. What should I know about heart disease, diabetes, and high blood  pressure? Blood pressure and heart disease  High blood pressure causes heart disease and increases the risk of stroke. This is more likely to develop in people who have high blood pressure readings, are of African descent, or are overweight.  Have your blood pressure checked: ? Every 3-5 years if you are 18-39 years of age. ? Every year if you are 40 years old or older. Diabetes Have regular diabetes screenings. This checks your fasting blood sugar level. Have the screening done:  Once every three years after age 40 if you are at a normal weight and have a low risk for diabetes.  More often and at a younger age if you are overweight or have a high risk for diabetes. What should I know about preventing infection? Hepatitis B If you have a higher risk for hepatitis B, you should be screened for this virus. Talk with your health care provider to find out if you are at risk for hepatitis B infection. Hepatitis C Testing is recommended for:  Everyone born from 1945 through 1965.  Anyone with known risk factors for hepatitis C. Sexually transmitted infections (STIs)  Get screened for STIs, including gonorrhea and chlamydia, if: ? You are sexually active and are younger than 60 years of age. ? You are older than 60 years of age and your health care provider tells you that you are at risk for this type of infection. ? Your sexual activity has changed since you were last screened, and you are at increased risk for chlamydia or gonorrhea. Ask your health care provider if   you are at risk.  Ask your health care provider about whether you are at high risk for HIV. Your health care provider may recommend a prescription medicine to help prevent HIV infection. If you choose to take medicine to prevent HIV, you should first get tested for HIV. You should then be tested every 3 months for as long as you are taking the medicine. Pregnancy  If you are about to stop having your period (premenopausal) and  you may become pregnant, seek counseling before you get pregnant.  Take 400 to 800 micrograms (mcg) of folic acid every day if you become pregnant.  Ask for birth control (contraception) if you want to prevent pregnancy. Osteoporosis and menopause Osteoporosis is a disease in which the bones lose minerals and strength with aging. This can result in bone fractures. If you are 65 years old or older, or if you are at risk for osteoporosis and fractures, ask your health care provider if you should:  Be screened for bone loss.  Take a calcium or vitamin D supplement to lower your risk of fractures.  Be given hormone replacement therapy (HRT) to treat symptoms of menopause. Follow these instructions at home: Lifestyle  Do not use any products that contain nicotine or tobacco, such as cigarettes, e-cigarettes, and chewing tobacco. If you need help quitting, ask your health care provider.  Do not use street drugs.  Do not share needles.  Ask your health care provider for help if you need support or information about quitting drugs. Alcohol use  Do not drink alcohol if: ? Your health care provider tells you not to drink. ? You are pregnant, may be pregnant, or are planning to become pregnant.  If you drink alcohol: ? Limit how much you use to 0-1 drink a day. ? Limit intake if you are breastfeeding.  Be aware of how much alcohol is in your drink. In the [place] one drink equals one 12 oz bottle of beer (355 mL), one 5 oz glass of wine (148 mL), or one 1 oz glass of hard liquor (44 mL). General instructions  Schedule regular health, dental, and eye exams.  Stay current with your vaccines.  Tell your health care provider if: ? You often feel depressed. ? You have ever been abused or do not feel safe at home. Summary  Adopting a healthy lifestyle and getting preventive care are important in promoting health and wellness.  Follow your health care provider's instructions about healthy  diet, exercising, and getting tested or screened for diseases.  Follow your health care provider's instructions on monitoring your cholesterol and blood pressure. This information is not intended to replace advice given to you by your health care provider. Make sure you discuss any questions you have with your health care provider. Document Revised: 04/01/2018 Document Reviewed: 04/01/2018 Elsevier Patient Education  2020 [employer]  

## 2019-12-31 NOTE — Assessment & Plan Note (Signed)
Patient is a 59 year old female who presents to clinic today for an annual physical exam and establishing care. Patient is reporting increased weakness, and low energy. Patient reports weakness is greater after shower and will need a shower chair. Completed head to toe assessment with printed handouts given for health maintenance education. Labs completed today, CMP, TSH, lipid panel, and hemoglobin A1c. Shower chair ordered. Advised patient on smoking cessation, patient is not ready to start quitting.

## 2020-01-01 LAB — HEPATIC FUNCTION PANEL
ALT: 16 IU/L (ref 0–32)
AST: 12 IU/L (ref 0–40)
Albumin: 4.7 g/dL (ref 3.8–4.9)
Alkaline Phosphatase: 83 IU/L (ref 48–121)
Bilirubin Total: 0.3 mg/dL (ref 0.0–1.2)
Bilirubin, Direct: 0.09 mg/dL (ref 0.00–0.40)
Total Protein: 7.1 g/dL (ref 6.0–8.5)

## 2020-01-01 LAB — LIPID PANEL
Chol/HDL Ratio: 7 ratio — ABNORMAL HIGH (ref 0.0–4.4)
Cholesterol, Total: 285 mg/dL — ABNORMAL HIGH (ref 100–199)
HDL: 41 mg/dL (ref >39)
LDL Chol Calc (NIH): 201 mg/dL — ABNORMAL HIGH (ref 0–99)
Triglycerides: 222 mg/dL — ABNORMAL HIGH (ref 0–149)
VLDL Cholesterol Cal: 43 mg/dL — ABNORMAL HIGH (ref 5–40)

## 2020-01-01 LAB — BMP8+EGFR
BUN/Creatinine Ratio: 24 (ref 12–28)
BUN: 16 mg/dL (ref 8–27)
CO2: 21 mmol/L (ref 20–29)
Calcium: 9.6 mg/dL (ref 8.7–10.3)
Chloride: 99 mmol/L (ref 96–106)
Creatinine, Ser: 0.67 mg/dL (ref 0.57–1.00)
GFR calc Af Amer: 110 mL/min/{1.73_m2} (ref >59)
GFR calc non Af Amer: 96 mL/min/{1.73_m2} (ref >59)
Glucose: 87 mg/dL (ref 65–99)
Potassium: 4.6 mmol/L (ref 3.5–5.2)
Sodium: 135 mmol/L (ref 134–144)

## 2020-01-01 LAB — TSH: TSH: 2.41 u[IU]/mL (ref 0.450–4.500)

## 2020-01-07 ENCOUNTER — Other Ambulatory Visit (HOSPITAL_COMMUNITY): Payer: Self-pay | Admitting: Family Medicine

## 2020-01-07 DIAGNOSIS — Z1231 Encounter for screening mammogram for malignant neoplasm of breast: Secondary | ICD-10-CM

## 2021-05-10 ENCOUNTER — Telehealth: Payer: Self-pay | Admitting: Nurse Practitioner

## 2021-06-01 ENCOUNTER — Ambulatory Visit (INDEPENDENT_AMBULATORY_CARE_PROVIDER_SITE_OTHER): Payer: Medicare HMO | Admitting: Nurse Practitioner

## 2021-06-01 ENCOUNTER — Encounter: Payer: Self-pay | Admitting: Nurse Practitioner

## 2021-06-01 VITALS — BP 115/81 | HR 69 | Temp 98.8°F | Ht 62.5 in | Wt 157.0 lb

## 2021-06-01 DIAGNOSIS — I10 Essential (primary) hypertension: Secondary | ICD-10-CM | POA: Diagnosis not present

## 2021-06-01 DIAGNOSIS — K219 Gastro-esophageal reflux disease without esophagitis: Secondary | ICD-10-CM | POA: Diagnosis not present

## 2021-06-01 DIAGNOSIS — F419 Anxiety disorder, unspecified: Secondary | ICD-10-CM | POA: Diagnosis not present

## 2021-06-01 DIAGNOSIS — E7800 Pure hypercholesterolemia, unspecified: Secondary | ICD-10-CM

## 2021-06-01 NOTE — Assessment & Plan Note (Signed)
Anxiety well-controlled on current medication.  No changes necessary.  Follow-up as needed

## 2021-06-01 NOTE — Assessment & Plan Note (Signed)
No new worsening symptoms.  GERD well controlled on current medication.  Follow-up as needed.

## 2021-06-01 NOTE — Patient Instructions (Signed)
Generalized Anxiety Disorder, Adult °Generalized anxiety disorder (GAD) is a mental health condition. Unlike normal worries, anxiety related to GAD is not triggered by a specific event. These worries do not fade or get better with time. GAD interferes with relationships, work, and school. °GAD symptoms can vary from mild to severe. People with severe GAD can have intense waves of anxiety with physical symptoms that are similar to panic attacks. °What are the causes? °The exact cause of GAD is not known, but the following are believed to have an impact: °Differences in natural brain chemicals. °Genes passed down from parents to children. °Differences in the way threats are perceived. °Development and stress during childhood. °Personality. °What increases the risk? °The following factors may make you more likely to develop this condition: °Being female. °Having a family history of anxiety disorders. °Being very shy. °Experiencing very stressful life events, such as the death of a loved one. °Having a very stressful family environment. °What are the signs or symptoms? °People with GAD often worry excessively about many things in their lives, such as their health and family. Symptoms may also include: °Mental and emotional symptoms: °Worrying excessively about natural disasters. °Fear of being late. °Difficulty concentrating. °Fears that others are judging your performance. °Physical symptoms: °Fatigue. °Headaches, muscle tension, muscle twitches, trembling, or feeling shaky. °Feeling like your heart is pounding or beating very fast. °Feeling out of breath or like you cannot take a deep breath. °Having trouble falling asleep or staying asleep, or experiencing restlessness. °Sweating. °Nausea, diarrhea, or irritable bowel syndrome (IBS). °Behavioral symptoms: °Experiencing erratic moods or irritability. °Avoidance of new situations. °Avoidance of people. °Extreme difficulty making decisions. °How is this diagnosed? °This  condition is diagnosed based on your symptoms and medical history. You will also have a physical exam. Your health care provider may perform tests to rule out other possible causes of your symptoms. °To be diagnosed with GAD, a person must have anxiety that: °Is out of his or her control. °Affects several different aspects of his or her life, such as work and relationships. °Causes distress that makes him or her unable to take part in normal activities. °Includes at least three symptoms of GAD, such as restlessness, fatigue, trouble concentrating, irritability, muscle tension, or sleep problems. °Before your health care provider can confirm a diagnosis of GAD, these symptoms must be present more days than they are not, and they must last for 6 months or longer. °How is this treated? °This condition may be treated with: °Medicine. Antidepressant medicine is usually prescribed for long-term daily control. Anti-anxiety medicines may be added in severe cases, especially when panic attacks occur. °Talk therapy (psychotherapy). Certain types of talk therapy can be helpful in treating GAD by providing support, education, and guidance. Options include: °Cognitive behavioral therapy (CBT). People learn coping skills and self-calming techniques to ease their physical symptoms. They learn to identify unrealistic thoughts and behaviors and to replace them with more appropriate thoughts and behaviors. °Acceptance and commitment therapy (ACT). This treatment teaches people how to be mindful as a way to cope with unwanted thoughts and feelings. °Biofeedback. This process trains you to manage your body's response (physiological response) through breathing techniques and relaxation methods. You will work with a therapist while machines are used to monitor your physical symptoms. °Stress management techniques. These include yoga, meditation, and exercise. °A mental health specialist can help determine which treatment is best for you.  Some people see improvement with one type of therapy. However, other people require   a combination of therapies. °Follow these instructions at home: °Lifestyle °Maintain a consistent routine and schedule. °Anticipate stressful situations. Create a plan and allow extra time to work with your plan. °Practice stress management or self-calming techniques that you have learned from your therapist or your health care provider. °Exercise regularly and spend time outdoors. °Eat a healthy diet that includes plenty of vegetables, fruits, whole grains, low-fat dairy products, and lean protein. °Do not eat a lot of foods that are high in fat, added sugar, or salt (sodium). °Drink plenty of water. °Avoid alcohol. Alcohol can increase anxiety. °Avoid caffeine and certain over-the-counter cold medicines. These may make you feel worse. Ask your pharmacist which medicines to avoid. °General instructions °Take over-the-counter and prescription medicines only as told by your health care provider. °Understand that you are likely to have setbacks. Accept this and be kind to yourself as you persist to take better care of yourself. °Anticipate stressful situations. Create a plan and allow extra time to work with your plan. °Recognize and accept your accomplishments, even if you judge them as small. °Spend time with people who care about you. °Keep all follow-up visits. This is important. °Where to find more information °National Institute of Mental Health: www.nimh.nih.gov °Substance Abuse and Mental Health Services: www.samhsa.gov °Contact a health care provider if: °Your symptoms do not get better. °Your symptoms get worse. °You have signs of depression, such as: °A persistently sad or irritable mood. °Loss of enjoyment in activities that used to bring you joy. °Change in weight or eating. °Changes in sleeping habits. °Get help right away if: °You have thoughts about hurting yourself or others. °If you ever feel like you may hurt  yourself or others, or have thoughts about taking your own life, get help right away. Go to your nearest emergency department or: °Call your local emergency services (911 in the [place] °Call a suicide crisis helpline, such as the National Suicide Prevention Lifeline at 1-800-273-8255 or 988 in the [place] This is open 24 hours a day in the [place] °Text the Crisis Text Line at 741741 (in the [place] °Summary °Generalized anxiety disorder (GAD) is a mental health condition that involves worry that is not triggered by a specific event. °People with GAD often worry excessively about many things in their lives, such as their health and family. °GAD may cause symptoms such as restlessness, trouble concentrating, sleep problems, frequent sweating, nausea, diarrhea, headaches, and trembling or muscle twitching. °A mental health specialist can help determine which treatment is best for you. Some people see improvement with one type of therapy. However, other people require a combination of therapies. °This information is not intended to replace advice given to you by your health care provider. Make sure you discuss any questions you have with your health care provider. °Document Revised: 11/01/2020 Document Reviewed: 07/30/2020 °Elsevier Patient Education © 2022 [employer] °Hypertension, Adult °Hypertension is another name for high blood pressure. High blood pressure forces your heart to work harder to pump blood. This can cause problems over time. °There are two numbers in a blood pressure reading. There is a top number (systolic) over a bottom number (diastolic). It is best to have a blood pressure that is below 120/80. Healthy choices can help lower your blood pressure, or you may need medicine to help lower it. °What are the causes? °The cause of this condition is not known. Some conditions may be related to high blood pressure. °What increases the risk? °Smoking. °Having type 2 diabetes mellitus,   high cholesterol, or  both. °Not getting enough exercise or physical activity. °Being overweight. °Having too much fat, sugar, calories, or salt (sodium) in your diet. °Drinking too much alcohol. °Having long-term (chronic) kidney disease. °Having a family history of high blood pressure. °Age. Risk increases with age. °Race. You may be at higher risk if you are African American. °Gender. Men are at higher risk than women before age 45. After age 65, women are at higher risk than men. °Having obstructive sleep apnea. °Stress. °What are the signs or symptoms? °High blood pressure may not cause symptoms. Very high blood pressure (hypertensive crisis) may cause: °Headache. °Feelings of worry or nervousness (anxiety). °Shortness of breath. °Nosebleed. °A feeling of being sick to your stomach (nausea). °Throwing up (vomiting). °Changes in how you see. °Very bad chest pain. °Seizures. °How is this treated? °This condition is treated by making healthy lifestyle changes, such as: °Eating healthy foods. °Exercising more. °Drinking less alcohol. °Your health care provider may prescribe medicine if lifestyle changes are not enough to get your blood pressure under control, and if: °Your top number is above 130. °Your bottom number is above 80. °Your personal target blood pressure may vary. °Follow these instructions at home: °Eating and drinking ° °If told, follow the DASH eating plan. To follow this plan: °Fill one half of your plate at each meal with fruits and vegetables. °Fill one fourth of your plate at each meal with whole grains. Whole grains include whole-wheat pasta, brown rice, and whole-grain bread. °Eat or drink low-fat dairy products, such as skim milk or low-fat yogurt. °Fill one fourth of your plate at each meal with low-fat (lean) proteins. Low-fat proteins include fish, chicken without skin, eggs, beans, and tofu. °Avoid fatty meat, cured and processed meat, or chicken with skin. °Avoid pre-made or processed food. °Eat less than  1,500 mg of salt each day. °Do not drink alcohol if: °Your doctor tells you not to drink. °You are pregnant, may be pregnant, or are planning to become pregnant. °If you drink alcohol: °Limit how much you use to: °0-1 drink a day for women. °0-2 drinks a day for men. °Be aware of how much alcohol is in your drink. In the [place] one drink equals one 12 oz bottle of beer (355 mL), one 5 oz glass of wine (148 mL), or one 1½ oz glass of hard liquor (44 mL). °Lifestyle ° °Work with your doctor to stay at a healthy weight or to lose weight. Ask your doctor what the best weight is for you. °Get at least 30 minutes of exercise most days of the week. This may include walking, swimming, or biking. °Get at least 30 minutes of exercise that strengthens your muscles (resistance exercise) at least 3 days a week. This may include lifting weights or doing Pilates. °Do not use any products that contain nicotine or tobacco, such as cigarettes, e-cigarettes, and chewing tobacco. If you need help quitting, ask your doctor. °Check your blood pressure at home as told by your doctor. °Keep all follow-up visits as told by your doctor. This is important. °Medicines °Take over-the-counter and prescription medicines only as told by your doctor. Follow directions carefully. °Do not skip doses of blood pressure medicine. The medicine does not work as well if you skip doses. Skipping doses also puts you at risk for problems. °Ask your doctor about side effects or reactions to medicines that you should watch for. °Contact a doctor if you: °Think you are having a reaction to   the medicine you are taking. °Have headaches that keep coming back (recurring). °Feel dizzy. °Have swelling in your ankles. °Have trouble with your vision. °Get help right away if you: °Get a very bad headache. °Start to feel mixed up (confused). °Feel weak or numb. °Feel faint. °Have very bad pain in your: °Chest. °Belly (abdomen). °Throw up more than once. °Have trouble  breathing. °Summary °Hypertension is another name for high blood pressure. °High blood pressure forces your heart to work harder to pump blood. °For most people, a normal blood pressure is less than 120/80. °Making healthy choices can help lower blood pressure. If your blood pressure does not get lower with healthy choices, you may need to take medicine. °This information is not intended to replace advice given to you by your health care provider. Make sure you discuss any questions you have with your health care provider. °Document Revised: 12/17/2017 Document Reviewed: 12/17/2017 °Elsevier Patient Education © 2022 [employer] ° °

## 2021-06-01 NOTE — Progress Notes (Signed)
Established Patient Office Visit  Subjective:  Patient ID: Angela Roth, female    DOB: 1960/12/29  Age: 61 y.o. MRN: [id number]  CC:  Chief Complaint  Patient presents with   chronic disease management    HPI Angela Roth presents for follow up of hypertension. Patient was diagnosed in few years ago. The patient is tolerating the medication well without side effects. Compliance with treatment has been good; including taking medication as directed , maintains a healthy diet and regular exercise regimen , and following up as directed. Current medication lisinopril 10 mg tablet daily  Anxiety, Follow-up  She was last seen for anxiety 2 years ago. Changes made at last visit include: no changes made   She reports good compliance with treatment. She reports good tolerance of treatment. She is not having side effects.   She feels her anxiety is mild and Unchanged since last visit.  Symptoms: No chest pain No difficulty concentrating  No dizziness No fatigue  No feelings of losing control No insomnia  No irritable No palpitations  Yes panic attacks Yes racing thoughts  No shortness of breath No sweating  No tremors/shakes    GAD-7 Results GAD-7 Generalized Anxiety Disorder Screening Tool 06/01/2021  1. Feeling Nervous, Anxious, or on Edge 1  2. Not Being Able to Stop or Control Worrying 1  3. Worrying Too Much About Different Things 1  4. Trouble Relaxing 1  5. Being So Restless it's Hard To Sit Still 0  6. Becoming Easily Annoyed or Irritable 1  7. Feeling Afraid As If Something Awful Might Happen 1  Total GAD-7 Score 6  Difficulty At Work, Home, or Getting  Along With Others? Somewhat difficult    PHQ-9 Scores PHQ9 SCORE ONLY 06/01/2021  PHQ-9 Total Score 7     GERD, Follow up:  The patient was last seen for GERD 2 years ago. Changes made since that visit include Maalox/Mylanta.  She reports good compliance with treatment. She is not having side effects.  .  She IS experiencing  no worsening symptoms . She is NOT experiencing abdominal bloating, belching and eructation, chest pain, choking on food, or deep pressure at base of neck    Past Medical History:  Diagnosis Date   Arthritis    CAD (coronary artery disease)    a. inferior STEMI 10/2015 s/p DESx3 to RCA.   Chronic headaches    Chronic sinusitis    COPD (chronic obstructive pulmonary disease) (HCC)    Depression    Essential hypertension    Fibromyalgia    GERD (gastroesophageal reflux disease)    History of ST elevation myocardial infarction (STEMI)    Hyperlipidemia    Hypothyroidism    Irritable bowel syndrome (IBS) AGE 59   DIARRHEA PREDOMINANT   LVF (left ventricular failure) ([facility])    a. Transient LV failure during STEMI 10/2015 - EF 45-50% with f/u EF 55% by echo same adm.   Lymphocytic colitis 2010   Tobacco abuse     Past Surgical History:  Procedure Laterality Date   BACK SURGERY     CARDIAC CATHETERIZATION N/A 11/18/2015   Procedure: Left Heart Cath and Coronary Angiography;  Surgeon: Belva Crome, MD;  Location: [facility];  Service: Cardiovascular;  Laterality: N/A;   CARDIAC CATHETERIZATION N/A 11/18/2015   Procedure: Coronary Stent Intervention;  Surgeon: Belva Crome, MD;  Location: [facility];  Service: Cardiovascular;  Laterality: N/A;   CARDIAC CATHETERIZATION N/A 02/01/2016  Procedure: Left Heart Cath and Coronary Angiography;  Surgeon: Belva Crome, MD;  Location: [facility];  Service: Cardiovascular;  Laterality: N/A;   CARDIAC CATHETERIZATION N/A 02/01/2016   Procedure: Intravascular Pressure Wire/FFR Study;  Surgeon: Belva Crome, MD;  Location: [facility];  Service: Cardiovascular;  Laterality: N/A;   CESAREAN SECTION     CHOLECYSTECTOMY     EYE SURGERY     FOOT SURGERY     left fooot = x3    TOTAL ABDOMINAL HYSTERECTOMY W/ BILATERAL SALPINGOOPHORECTOMY      Family History  Problem Relation Age of Onset    Lung cancer Maternal Grandmother    Ovarian cancer Maternal Grandmother    Asthma Brother    Hypertension Brother    Stomach cancer Mother    Colon polyps Mother    Cancer Mother        non hodg. lymphoma    Cancer Father        lung cancer / black lung    Suicidality Father    Thyroid disease Sister    Irritable bowel syndrome Son    Colon cancer Neg Hx     Social History   Socioeconomic History   Marital status: Married    Spouse name: Gwyndolyn Saxon    Number of children: 3   Years of education: Not on file   Highest education level: Not on file  Occupational History   Occupation: disabled   Tobacco Use   Smoking status: Every Day    Packs/day: 1.00    Years: 42.00    Pack years: 42.00    Types: Cigarettes    Start date: 09/12/1971   Smokeless tobacco: Never  Vaping Use   Vaping Use: Never used  Substance and Sexual Activity   Alcohol use: No    Alcohol/week: 0.0 standard drinks   Drug use: No   Sexual activity: Yes    Partners: Male  Other Topics Concern   Not on file  Social History Narrative   WAS [place]. SERVED FOR ALMOST 13 YRS. GOT OUT BECAUSE SHE INJURED HER BACK. THREE KIDS AND MARRIED. SPENDS FREE TIME:  NOTHING BECAUSE HAD CATARACTS AND WAS BLIND. IS A HOME BODY DUE TO PTSD/ANXIETY AND BOWELS. MARRIED FOR PAST 5-6 YRS. DIDN'T WANT TO GROW OLD ALONE AND NEEDED FATHER FOR HER SON & HAS 2 CATS, 3 DOGS.         [place]: [place]. BEEN IN [place] FOR 10 YRS.   Social Determinants of Health   Financial Resource Strain: Not on file  Food Insecurity: Not on file  Transportation Needs: Not on file  Physical Activity: Not on file  Stress: Not on file  Social Connections: Not on file  Intimate Partner Violence: Not on file    Outpatient Medications Prior to Visit  Medication Sig Dispense Refill   acetaminophen (TYLENOL) 500 MG tablet Take 1,000 mg by mouth every 6 (six) hours as needed for headache.     albuterol (VENTOLIN HFA) 108  (90 Base) MCG/ACT inhaler INHALE TWO PUFFS BY MOUTH EVERY 4 TO 6 HOURS AS NEEDED FOR WHEEZING     alum & mag hydroxide-simeth (MAALOX/MYLANTA) 200-200-20 MG/5ML suspension Take 30 mLs by mouth every 6 (six) hours as needed for indigestion or heartburn.     aspirin EC 81 MG tablet Take 1 tablet (81 mg total) by mouth daily.     carvedilol (COREG) 25 MG tablet Take 25 mg  by mouth daily.     diclofenac Sodium (VOLTAREN) 1 % GEL Apply topically 4 (four) times daily.     dicyclomine (BENTYL) 10 MG capsule 1 PO 30 MINUTES PRIOR TO FIRST MEAL (Patient taking differently: Take 10 mg by mouth daily before breakfast. 1 PO 30 MINUTES PRIOR TO FIRST MEAL) 30 capsule 11   fluticasone (FLONASE) 50 MCG/ACT nasal spray Place into both nostrils daily.     hydrOXYzine (VISTARIL) 25 MG capsule Take 25 mg by mouth 3 (three) times daily as needed for anxiety.      lisinopril (PRINIVIL,ZESTRIL) 10 MG tablet Take 10 mg by mouth daily.      Melatonin 3 MG TABS Take 3 mg by mouth at bedtime as needed (sleep).      methocarbamol (ROBAXIN) 500 MG tablet Take 500 mg by mouth at bedtime.      nitroGLYCERIN (NITROSTAT) 0.4 MG SL tablet Place 1 tablet (0.4 mg total) under the tongue every 5 (five) minutes as needed for chest pain. 25 tablet 3   LORazepam (ATIVAN) 1 MG tablet Take 1 tablet (1 mg total) by mouth every 6 (six) hours as needed for anxiety. (Patient taking differently: Take 1 mg by mouth every 8 (eight) hours as needed for anxiety.) 10 tablet 0   No facility-administered medications prior to visit.    Allergies  Allergen Reactions   Indomethacin Other (See Comments)    Patient has bad side effects "suicidal thoughts/homicidal thoughts"   Prednisone     Makes Mean, contributed to cataracts     ROS Review of Systems  Constitutional: Negative.   HENT: Negative.    Respiratory: Negative.    Cardiovascular: Negative.   Gastrointestinal: Negative.   Musculoskeletal: Negative.   Skin: Negative.  Negative for  rash.  All other systems reviewed and are negative.    Objective:    Physical Exam Vitals and nursing note reviewed.  Constitutional:      Appearance: Normal appearance.  HENT:     Right Ear: External ear normal.     Left Ear: External ear normal.     Nose: Nose normal.     Mouth/Throat:     Mouth: Mucous membranes are moist.     Pharynx: Oropharynx is clear.  Eyes:     Conjunctiva/sclera: Conjunctivae normal.  Cardiovascular:     Pulses: Normal pulses.     Heart sounds: Normal heart sounds.  Abdominal:     General: Bowel sounds are normal.  Musculoskeletal:        General: Normal range of motion.  Skin:    General: Skin is warm.     Findings: No rash.  Neurological:     Mental Status: She is alert and oriented to person, place, and time.  Psychiatric:        Behavior: Behavior normal.    BP 115/81    Pulse 69    Temp 98.8 F (37.1 C)    Ht 5' 2.5" (1.588 m)    Wt 157 lb (71.2 kg)    SpO2 97%    BMI 28.26 kg/m  Wt Readings from Last 3 Encounters:  06/01/21 157 lb (71.2 kg)  12/31/19 153 lb (69.4 kg)  04/23/18 155 lb 12.8 oz (70.7 kg)     There are no preventive care reminders to display for this patient.  There are no preventive care reminders to display for this patient.  Lab Results  Component Value Date   TSH 2.410 12/31/2019   Lab Results  Component Value Date   WBC 8.7 02/02/2016   HGB 11.9 (L) 02/02/2016   HCT 35.9 (L) 02/02/2016   MCV 92.8 02/02/2016   PLT 254 02/02/2016   Lab Results  Component Value Date   NA 135 12/31/2019   K 4.6 12/31/2019   CO2 21 12/31/2019   GLUCOSE 87 12/31/2019   BUN 16 12/31/2019   CREATININE 0.67 12/31/2019   BILITOT 0.3 12/31/2019   ALKPHOS 83 12/31/2019   AST 12 12/31/2019   ALT 16 12/31/2019   PROT 7.1 12/31/2019   ALBUMIN 4.7 12/31/2019   CALCIUM 9.6 12/31/2019   ANIONGAP 9 02/02/2016   Lab Results  Component Value Date   CHOL 285 (H) 12/31/2019   Lab Results  Component Value Date   HDL 41  12/31/2019   Lab Results  Component Value Date   LDLCALC 201 (H) 12/31/2019   Lab Results  Component Value Date   TRIG 222 (H) 12/31/2019   Lab Results  Component Value Date   CHOLHDL 7.0 (H) 12/31/2019   Lab Results  Component Value Date   HGBA1C 5.8 12/31/2019      Assessment & Plan:   Problem List Items Addressed This Visit       Cardiovascular and Mediastinum   Essential hypertension - Primary    Essential hypertension well-controlled on current medication lisinopril 5 mg tablet by mouth daily.  Completed labs CBC, CMP, lipid panel.  Follow-up in 6 months.      Relevant Orders   CBC with Differential   Comprehensive metabolic panel   Lipid Panel     Digestive   Gastroesophageal reflux disease    No new worsening symptoms.  GERD well controlled on current medication.  Follow-up as needed.        Other   Hyperlipidemia   Anxiety disorder    Anxiety well-controlled on current medication.  No changes necessary.  Follow-up as needed       No orders of the defined types were placed in this encounter.   Follow-up: Return in about 6 months (around 11/29/2021).    Ivy Lynn, NP

## 2021-06-01 NOTE — Assessment & Plan Note (Signed)
Essential hypertension well-controlled on current medication lisinopril 5 mg tablet by mouth daily.  Completed labs CBC, CMP, lipid panel.  Follow-up in 6 months.

## 2021-06-02 LAB — CBC WITH DIFFERENTIAL/PLATELET
Basophils Absolute: 0.1 10*3/uL (ref 0.0–0.2)
Basos: 1 %
EOS (ABSOLUTE): 0.6 10*3/uL — ABNORMAL HIGH (ref 0.0–0.4)
Eos: 6 %
Hematocrit: 40.5 % (ref 34.0–46.6)
Hemoglobin: 13.9 g/dL (ref 11.1–15.9)
Immature Grans (Abs): 0 10*3/uL (ref 0.0–0.1)
Immature Granulocytes: 0 %
Lymphocytes Absolute: 2.8 10*3/uL (ref 0.7–3.1)
Lymphs: 26 %
MCH: 32.3 pg (ref 26.6–33.0)
MCHC: 34.3 g/dL (ref 31.5–35.7)
MCV: 94 fL (ref 79–97)
Monocytes Absolute: 0.8 10*3/uL (ref 0.1–0.9)
Monocytes: 8 %
Neutrophils Absolute: 6.4 10*3/uL (ref 1.4–7.0)
Neutrophils: 59 %
Platelets: 329 10*3/uL (ref 150–450)
RBC: 4.31 x10E6/uL (ref 3.77–5.28)
RDW: 13 % (ref 11.7–15.4)
WBC: 10.8 10*3/uL (ref 3.4–10.8)

## 2021-06-02 LAB — COMPREHENSIVE METABOLIC PANEL
ALT: 13 IU/L (ref 0–32)
AST: 14 IU/L (ref 0–40)
Albumin/Globulin Ratio: 1.8 (ref 1.2–2.2)
Albumin: 4.6 g/dL (ref 3.8–4.8)
Alkaline Phosphatase: 99 IU/L (ref 44–121)
BUN/Creatinine Ratio: 13 (ref 12–28)
BUN: 9 mg/dL (ref 8–27)
Bilirubin Total: 0.4 mg/dL (ref 0.0–1.2)
CO2: 21 mmol/L (ref 20–29)
Calcium: 9.6 mg/dL (ref 8.7–10.3)
Chloride: 98 mmol/L (ref 96–106)
Creatinine, Ser: 0.71 mg/dL (ref 0.57–1.00)
Globulin, Total: 2.5 g/dL (ref 1.5–4.5)
Glucose: 86 mg/dL (ref 70–99)
Potassium: 4.3 mmol/L (ref 3.5–5.2)
Sodium: 137 mmol/L (ref 134–144)
Total Protein: 7.1 g/dL (ref 6.0–8.5)
eGFR: 97 mL/min/{1.73_m2} (ref >59)

## 2021-06-02 LAB — LIPID PANEL
Chol/HDL Ratio: 7.4 ratio — ABNORMAL HIGH (ref 0.0–4.4)
Cholesterol, Total: 294 mg/dL — ABNORMAL HIGH (ref 100–199)
HDL: 40 mg/dL (ref >39)
LDL Chol Calc (NIH): 197 mg/dL — ABNORMAL HIGH (ref 0–99)
Triglycerides: 289 mg/dL — ABNORMAL HIGH (ref 0–149)
VLDL Cholesterol Cal: 57 mg/dL — ABNORMAL HIGH (ref 5–40)

## 2021-06-04 ENCOUNTER — Other Ambulatory Visit: Payer: Self-pay | Admitting: Nurse Practitioner

## 2021-06-04 DIAGNOSIS — E782 Mixed hyperlipidemia: Secondary | ICD-10-CM

## 2021-06-04 MED ORDER — ROSUVASTATIN CALCIUM 10 MG PO TABS: 10.0000 mg | ORAL_TABLET | Freq: Every day | ORAL | 1 refills | Status: DC

## 2021-06-08 ENCOUNTER — Ambulatory Visit (INDEPENDENT_AMBULATORY_CARE_PROVIDER_SITE_OTHER): Payer: Medicare HMO

## 2021-06-08 VITALS — Wt 157.0 lb

## 2021-06-08 DIAGNOSIS — Z0000 Encounter for general adult medical examination without abnormal findings: Secondary | ICD-10-CM | POA: Diagnosis not present

## 2021-06-08 NOTE — Patient Instructions (Signed)
Angela Roth , Thank you for taking time to come for your Medicare Wellness Visit. I appreciate your ongoing commitment to your health goals. Please review the following plan we discussed and let me know if I can assist you in the future.   Screening recommendations/referrals: Colonoscopy: Recommended - [facility] with [facility] Mammogram: Recommended annually - [facility] with [facility] Bone Density: Recommended starting at age 62 every 2 years Recommended yearly ophthalmology/optometry visit for glaucoma screening and checkup Recommended yearly dental visit for hygiene and checkup  Vaccinations: declines all vaccines Influenza vaccine: Due Pneumococcal vaccine: Due Tdap vaccine: Due Shingles vaccine: Due  Covid-19: Due  Advanced directives: Please bring a copy of your health care power of attorney and living will to the office to be added to your chart at your convenience.   Conditions/risks identified: Aim for 30 minutes of exercise or brisk walking each day, drink 6-8 glasses of water and eat lots of fruits and vegetables.   Next appointment: Follow up in one year for your annual wellness visit.   Preventive Care 40-64 Years, Female Preventive care refers to lifestyle choices and visits with your health care provider that can promote health and wellness. What does preventive care include? A yearly physical exam. This is also called an annual well check. Dental exams once or twice a year. Routine eye exams. Ask your health care provider how often you should have your eyes checked. Personal lifestyle choices, including: Daily care of your teeth and gums. Regular physical activity. Eating a healthy diet. Avoiding tobacco and drug use. Limiting alcohol use. Practicing safe sex. Taking low-dose aspirin daily starting at age 49. Taking vitamin and mineral supplements as recommended by your health care provider. What happens during an annual well check? The services and screenings done by your health care  provider during your annual well check will depend on your age, overall health, lifestyle risk factors, and family history of disease. Counseling  Your health care provider may ask you questions about your: Alcohol use. Tobacco use. Drug use. Emotional well-being. Home and relationship well-being. Sexual activity. Eating habits. Work and work Statistician. Method of birth control. Menstrual cycle. Pregnancy history. Screening  You may have the following tests or measurements: Height, weight, and BMI. Blood pressure. Lipid and cholesterol levels. These may be checked every 5 years, or more frequently if you are over 53 years old. Skin check. Lung cancer screening. You may have this screening every year starting at age 78 if you have a 30-pack-year history of smoking and currently smoke or have quit within the past 15 years. Fecal occult blood test (FOBT) of the stool. You may have this test every year starting at age 42. Flexible sigmoidoscopy or colonoscopy. You may have a sigmoidoscopy every 5 years or a colonoscopy every 10 years starting at age 53. Hepatitis C blood test. Hepatitis B blood test. Sexually transmitted disease (STD) testing. Diabetes screening. This is done by checking your blood sugar (glucose) after you have not eaten for a while (fasting). You may have this done every 1-3 years. Mammogram. This may be done every 1-2 years. Talk to your health care provider about when you should start having regular mammograms. This may depend on whether you have a family history of breast cancer. BRCA-related cancer screening. This may be done if you have a family history of breast, ovarian, tubal, or peritoneal cancers. Pelvic exam and Pap test. This may be done every 3 years starting at age 30. Starting at age 58, this  may be done every 5 years if you have a Pap test in combination with an HPV test. Bone density scan. This is done to screen for osteoporosis. You may have this scan  if you are at high risk for osteoporosis. Discuss your test results, treatment options, and if necessary, the need for more tests with your health care provider. Vaccines  Your health care provider may recommend certain vaccines, such as: Influenza vaccine. This is recommended every year. Tetanus, diphtheria, and acellular pertussis (Tdap, Td) vaccine. You may need a Td booster every 10 years. Zoster vaccine. You may need this after age 62. Pneumococcal 13-valent conjugate (PCV13) vaccine. You may need this if you have certain conditions and were not previously vaccinated. Pneumococcal polysaccharide (PPSV23) vaccine. You may need one or two doses if you smoke cigarettes or if you have certain conditions. Talk to your health care provider about which screenings and vaccines you need and how often you need them. This information is not intended to replace advice given to you by your health care provider. Make sure you discuss any questions you have with your health care provider. Document Released: 05/05/2015 Document Revised: 12/27/2015 Document Reviewed: 02/07/2015 Elsevier Interactive Patient Education  2017 [place] Prevention in the Home Falls can cause injuries. They can happen to people of all ages. There are many things you can do to make your home safe and to help prevent falls. What can I do on the outside of my home? Regularly fix the edges of walkways and driveways and fix any cracks. Remove anything that might make you trip as you walk through a door, such as a raised step or threshold. Trim any bushes or trees on the path to your home. Use bright outdoor lighting. Clear any walking paths of anything that might make someone trip, such as rocks or tools. Regularly check to see if handrails are loose or broken. Make sure that both sides of any steps have handrails. Any raised decks and porches should have guardrails on the edges. Have any leaves, snow, or ice cleared  regularly. Use sand or salt on walking paths during winter. Clean up any spills in your garage right away. This includes oil or grease spills. What can I do in the bathroom? Use night lights. Install grab bars by the toilet and in the tub and shower. Do not use towel bars as grab bars. Use non-skid mats or decals in the tub or shower. If you need to sit down in the shower, use a plastic, non-slip stool. Keep the floor dry. Clean up any water that spills on the floor as soon as it happens. Remove soap buildup in the tub or shower regularly. Attach bath mats securely with double-sided non-slip rug tape. Do not have throw rugs and other things on the floor that can make you trip. What can I do in the bedroom? Use night lights. Make sure that you have a light by your bed that is easy to reach. Do not use any sheets or blankets that are too big for your bed. They should not hang down onto the floor. Have a firm chair that has side arms. You can use this for support while you get dressed. Do not have throw rugs and other things on the floor that can make you trip. What can I do in the kitchen? Clean up any spills right away. Avoid walking on wet floors. Keep items that you use a lot in easy-to-reach places.  If you need to reach something above you, use a strong step stool that has a grab bar. Keep electrical cords out of the way. Do not use floor polish or wax that makes floors slippery. If you must use wax, use non-skid floor wax. Do not have throw rugs and other things on the floor that can make you trip. What can I do with my stairs? Do not leave any items on the stairs. Make sure that there are handrails on both sides of the stairs and use them. Fix handrails that are broken or loose. Make sure that handrails are as long as the stairways. Check any carpeting to make sure that it is firmly attached to the stairs. Fix any carpet that is loose or worn. Avoid having throw rugs at the top or  bottom of the stairs. If you do have throw rugs, attach them to the floor with carpet tape. Make sure that you have a light switch at the top of the stairs and the bottom of the stairs. If you do not have them, ask someone to add them for you. What else can I do to help prevent falls? Wear shoes that: Do not have high heels. Have rubber bottoms. Are comfortable and fit you well. Are closed at the toe. Do not wear sandals. If you use a stepladder: Make sure that it is fully opened. Do not climb a closed stepladder. Make sure that both sides of the stepladder are locked into place. Ask someone to hold it for you, if possible. Clearly mark and make sure that you can see: Any grab bars or handrails. First and last steps. Where the edge of each step is. Use tools that help you move around (mobility aids) if they are needed. These include: Canes. Walkers. Scooters. Crutches. Turn on the lights when you go into a dark area. Replace any light bulbs as soon as they burn out. Set up your furniture so you have a clear path. Avoid moving your furniture around. If any of your floors are uneven, fix them. If there are any pets around you, be aware of where they are. Review your medicines with your doctor. Some medicines can make you feel dizzy. This can increase your chance of falling. Ask your doctor what other things that you can do to help prevent falls. This information is not intended to replace advice given to you by your health care provider. Make sure you discuss any questions you have with your health care provider. Document Released: 02/02/2009 Document Revised: 09/14/2015 Document Reviewed: 05/13/2014 Elsevier Interactive Patient Education  2017 [employer].
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
## 2021-07-23 ENCOUNTER — Other Ambulatory Visit: Payer: Self-pay | Admitting: Nurse Practitioner

## 2021-07-23 NOTE — Telephone Encounter (Signed)
?  Prescription Request ? ?07/23/2021 ? ? ?What is the name of the medication or equipment? ALL ? ?Have you contacted your pharmacy to request a refill? YES ? ?Which pharmacy would you like this sent to? [facility] ? ? ?Pt called stating that she had her last visit with PCP in Feb 2023 and at that time she did not need refills on her meds but was told to just call the office and let us know when she needed refills and we would send them in for her. Pt is almost out and needs refills sent in.  ?  ?

## 2021-07-24 MED ORDER — METHOCARBAMOL 500 MG PO TABS: 500.0000 mg | ORAL_TABLET | Freq: Every day | ORAL | 0 refills | Status: DC

## 2021-07-24 MED ORDER — ALBUTEROL SULFATE HFA 108 (90 BASE) MCG/ACT IN AERS: INHALATION_SPRAY | RESPIRATORY_TRACT | 6 refills | Status: DC

## 2021-07-24 MED ORDER — HYDROXYZINE PAMOATE 25 MG PO CAPS: 25.0000 mg | ORAL_CAPSULE | Freq: Three times a day (TID) | ORAL | 3 refills | Status: DC | PRN

## 2021-07-24 MED ORDER — LISINOPRIL 10 MG PO TABS: 10.0000 mg | ORAL_TABLET | Freq: Every day | ORAL | 1 refills | Status: DC

## 2021-07-24 NOTE — Telephone Encounter (Signed)
TC to pt she is needing refills on her Lisinopril, Inhaler, muscle relaxer and Hydroxyzine to [place] ?

## 2021-07-30 ENCOUNTER — Telehealth: Payer: Self-pay | Admitting: Nurse Practitioner

## 2021-07-30 NOTE — Telephone Encounter (Signed)
?  Prescription Request ? ?07/30/2021 ? ?Is this a "Controlled Substance" medicine? no ? ?Have you seen your PCP in the last 2 weeks? no ? ?If YES, route message to pool  -  If NO, patient needs to be scheduled for appointment. ? ?What is the name of the medication or equipment? Lisinopril, Muscle Relaxer, Heart Meds? Sent to [facility] & Inhaler sent to [facility] ? ?Have you contacted your pharmacy to request a refill? yes  ? ?[facility] ? ? ?Patient notified that their request is being sent to the clinical staff for review and that they should receive a response within 2 business days.  ? ?Je's pt. ? ?Needs Inhaler ASAP ?  ?

## 2021-07-31 MED ORDER — ALBUTEROL SULFATE HFA 108 (90 BASE) MCG/ACT IN AERS: INHALATION_SPRAY | RESPIRATORY_TRACT | 6 refills | Status: DC

## 2021-07-31 MED ORDER — ALBUTEROL SULFATE HFA 108 (90 BASE) MCG/ACT IN AERS
INHALATION_SPRAY | RESPIRATORY_TRACT | 0 refills | Status: DC
Start: 2021-07-31 — End: 2021-07-31

## 2021-07-31 MED ORDER — METHOCARBAMOL 500 MG PO TABS
500.0000 mg | ORAL_TABLET | Freq: Every day | ORAL | 0 refills | Status: DC
Start: 2021-07-31 — End: 2021-11-13

## 2021-07-31 MED ORDER — HYDROXYZINE PAMOATE 25 MG PO CAPS: 25.0000 mg | ORAL_CAPSULE | Freq: Three times a day (TID) | ORAL | 3 refills | Status: DC | PRN

## 2021-07-31 MED ORDER — LISINOPRIL 10 MG PO TABS: 10.0000 mg | ORAL_TABLET | Freq: Every day | ORAL | 1 refills | Status: DC

## 2021-07-31 NOTE — Telephone Encounter (Signed)
Pt contacted [place] after last Tuesday & they did not receive refills. Albuterol, Lisinopril, Hydroxyzine & Methocarbamol resent. Albuterol inhaler also sent to [place] [facility] pharmacy ?

## 2021-08-29 ENCOUNTER — Telehealth: Payer: Self-pay | Admitting: Nurse Practitioner

## 2021-08-29 MED ORDER — ALBUTEROL SULFATE HFA 108 (90 BASE) MCG/ACT IN AERS
INHALATION_SPRAY | RESPIRATORY_TRACT | 1 refills | Status: DC
Start: 2021-08-29 — End: 2021-11-01

## 2021-08-29 NOTE — Telephone Encounter (Signed)
Pt aware refill sent to [facility] 

## 2021-08-29 NOTE — Telephone Encounter (Signed)
?  Prescription Request ? ?08/29/2021 ? ?Is this a "Controlled Substance" medicine? no ? ?Have you seen your PCP in the last 2 weeks? no ? ?If YES, route message to pool  -  If NO, patient needs to be scheduled for appointment. ? ?What is the name of the medication or equipment? albuterol (VENTOLIN HFA) 108 (90 Base) MCG/ACT inhaler ? ?Have you contacted your pharmacy to request a refill? Yes, needs it changed to different pharmacy   ? ?Which pharmacy would you like this sent to? [facility] in [place]  ? ? ?Patient notified that their request is being sent to the clinical staff for review and that they should receive a response within 2 business days.  ?  ?

## 2021-10-30 ENCOUNTER — Telehealth: Payer: Self-pay | Admitting: *Deleted

## 2021-10-30 DIAGNOSIS — E782 Mixed hyperlipidemia: Secondary | ICD-10-CM

## 2021-10-30 NOTE — Telephone Encounter (Signed)
TC from [facility] w/ patient on the line as well, requesting to change medications to their pharmacy & pt agrees. Pt aware will send a one time 3 mos supply, she will call back to make her 6 mos Fu w/ PCP in Aug.

## 2021-11-01 MED ORDER — HYDROXYZINE PAMOATE 25 MG PO CAPS: 25.0000 mg | ORAL_CAPSULE | Freq: Three times a day (TID) | ORAL | 0 refills | Status: DC | PRN

## 2021-11-01 MED ORDER — LISINOPRIL 10 MG PO TABS: 10.0000 mg | ORAL_TABLET | Freq: Every day | ORAL | 0 refills | Status: DC

## 2021-11-01 MED ORDER — ROSUVASTATIN CALCIUM 10 MG PO TABS: 10.0000 mg | ORAL_TABLET | Freq: Every day | ORAL | 0 refills | Status: DC

## 2021-11-01 MED ORDER — ALBUTEROL SULFATE HFA 108 (90 BASE) MCG/ACT IN AERS: INHALATION_SPRAY | RESPIRATORY_TRACT | 0 refills | Status: DC

## 2021-11-02 ENCOUNTER — Other Ambulatory Visit: Payer: Self-pay | Admitting: Nurse Practitioner

## 2021-11-05 ENCOUNTER — Other Ambulatory Visit: Payer: Self-pay | Admitting: *Deleted

## 2021-11-05 NOTE — Telephone Encounter (Signed)
Looks like this has been taking care of if not let me know patient's pharmacy should be changed in the chart.

## 2021-11-13 ENCOUNTER — Other Ambulatory Visit: Payer: Self-pay | Admitting: Nurse Practitioner

## 2021-11-13 MED ORDER — METHOCARBAMOL 500 MG PO TABS: 500.0000 mg | ORAL_TABLET | Freq: Every day | ORAL | 0 refills | Status: DC

## 2021-11-13 MED ORDER — CARVEDILOL 25 MG PO TABS: 25.0000 mg | ORAL_TABLET | Freq: Every day | ORAL | 1 refills | Status: DC

## 2021-11-13 NOTE — Telephone Encounter (Signed)
Je,  You seen this pt in Feb this year. You wanted her to follow up in Aug. There are no appts scheduled at this time other than her [facility] visit.  Are you alright to refill both? You are not the original prescriber of the Carvedilol.

## 2021-11-21 ENCOUNTER — Other Ambulatory Visit: Payer: Self-pay | Admitting: Nurse Practitioner

## 2021-11-21 ENCOUNTER — Telehealth: Payer: Self-pay | Admitting: *Deleted

## 2021-11-21 MED ORDER — METHOCARBAMOL 500 MG PO TABS: 500.0000 mg | ORAL_TABLET | Freq: Every day | ORAL | 1 refills | Status: DC

## 2021-11-21 NOTE — Telephone Encounter (Signed)
Fax from [facility] on behalf of [employer] Recent review of medication of pt's, If appropriate, please approve following recommendation by Shearon Balo, Pharmacy Recommendation: Pt has been having increased muscle pan/weakness and is requesting her cyclobenzaprine & methocarbamol to be refilled. Please send these in as e-scripts to [facility] so that they may package them to ship with the rest of her medications.  Send New or Changed Prescription to pt's pharmacy, or if there is another recommendation please respond to me & I will write it on the fax and send it back

## 2021-11-21 NOTE — Telephone Encounter (Signed)
I sent in a months supply with 1 refill, looks like patient was seen in february and may need an office visit and reevaluation of the pain before any further refill. She will be needing labs, I see hypertension in her problem list and I think she is managed by cardiology as well. thanks

## 2021-11-23 NOTE — Telephone Encounter (Signed)
TC to pt, she is aware & has appt first of Sept

## 2021-12-21 ENCOUNTER — Ambulatory Visit (INDEPENDENT_AMBULATORY_CARE_PROVIDER_SITE_OTHER): Payer: Medicare Other | Admitting: Nurse Practitioner

## 2021-12-21 ENCOUNTER — Ambulatory Visit (INDEPENDENT_AMBULATORY_CARE_PROVIDER_SITE_OTHER): Payer: Medicare Other

## 2021-12-21 ENCOUNTER — Encounter: Payer: Self-pay | Admitting: Nurse Practitioner

## 2021-12-21 VITALS — BP 107/74 | HR 86 | Temp 97.6°F | Resp 20 | Ht 62.0 in | Wt 149.0 lb

## 2021-12-21 DIAGNOSIS — B356 Tinea cruris: Secondary | ICD-10-CM | POA: Diagnosis not present

## 2021-12-21 DIAGNOSIS — E782 Mixed hyperlipidemia: Secondary | ICD-10-CM

## 2021-12-21 DIAGNOSIS — I2510 Atherosclerotic heart disease of native coronary artery without angina pectoris: Secondary | ICD-10-CM | POA: Diagnosis not present

## 2021-12-21 DIAGNOSIS — I10 Essential (primary) hypertension: Secondary | ICD-10-CM

## 2021-12-21 DIAGNOSIS — Z139 Encounter for screening, unspecified: Secondary | ICD-10-CM | POA: Diagnosis not present

## 2021-12-21 MED ORDER — FLUCONAZOLE 150 MG PO TABS: 150.0000 mg | ORAL_TABLET | ORAL | 0 refills | Status: DC

## 2021-12-21 MED ORDER — DICYCLOMINE HCL 10 MG PO CAPS: ORAL_CAPSULE | ORAL | 11 refills | Status: DC

## 2021-12-21 MED ORDER — CLOTRIMAZOLE 1 % EX CREA: 1.0000 | TOPICAL_CREAM | Freq: Two times a day (BID) | CUTANEOUS | 0 refills | Status: AC

## 2021-12-21 MED ORDER — ALBUTEROL SULFATE HFA 108 (90 BASE) MCG/ACT IN AERS: INHALATION_SPRAY | RESPIRATORY_TRACT | 3 refills | Status: DC

## 2021-12-21 NOTE — Patient Instructions (Signed)
Contact Dermatitis Dermatitis is redness, soreness, and swelling (inflammation) of the skin. Contact dermatitis is a reaction to something that touches the skin. There are two types of contact dermatitis: Irritant contact dermatitis. This happens when something bothers (irritates) your skin, like soap. Allergic contact dermatitis. This is caused when you are exposed to something that you are allergic to, such as poison ivy. What are the causes? Common causes of irritant contact dermatitis include: Makeup. Soaps. Detergents. Bleaches. Acids. Metals, such as nickel. Common causes of allergic contact dermatitis include: Plants. Chemicals. Jewelry. Latex. Medicines. Preservatives in products, such as clothing. What increases the risk? Having a job that exposes you to things that bother your skin. Having asthma or eczema. What are the signs or symptoms? Symptoms may happen anywhere the irritant has touched your skin. Symptoms include: Dry or flaky skin. Redness. Cracks. Itching. Pain or a burning feeling. Blisters. Blood or clear fluid draining from skin cracks. With allergic contact dermatitis, swelling may occur. This may happen in places such as the eyelids, mouth, or genitals. How is this treated? This condition is treated by checking for the cause of the reaction and protecting your skin. Treatment may also include: Steroid creams, ointments, or medicines. Antibiotic medicines or other ointments, if you have a skin infection. Lotion or medicines to help with itching. A bandage (dressing). Follow these instructions at home: Skin care Moisturize your skin as needed. Put cool cloths on your skin. Put a baking soda paste on your skin. Stir water into baking soda until it looks like a paste. Do not scratch your skin. Avoid having things rub up against your skin. Avoid the use of soaps, perfumes, and dyes. Medicines Take or apply over-the-counter and prescription medicines  only as told by your doctor. If you were prescribed an antibiotic medicine, take or apply it as told by your doctor. Do not stop using it even if your condition starts to get better. Bathing Take a bath with: Epsom salts. Baking soda. Colloidal oatmeal. Bathe less often. Bathe in warm water. Avoid using hot water. Bandage care If you were given a bandage, change it as told by your doctor. Wash your hands with soap and water before and after you change your bandage. If soap and water are not available, use hand sanitizer. General instructions Avoid the things that caused your reaction. If you do not know what caused it, keep a journal. Write down: What you eat. What skin products you use. What you drink. What you wear in the area that has symptoms. This includes jewelry. Check the affected areas every day for signs of infection. Check for: More redness, swelling, or pain. More fluid or blood. Warmth. Pus or a bad smell. Keep all follow-up visits as told by your doctor. This is important. Contact a doctor if: You do not get better with treatment. Your condition gets worse. You have signs of infection, such as: More swelling. Tenderness. More redness. Soreness. Warmth. You have a fever. You have new symptoms. Get help right away if: You have a very bad headache. You have neck pain. Your neck is stiff. You throw up (vomit). You feel very sleepy. You see red streaks coming from the area. Your bone or joint near the area hurts after the skin has healed. The area turns darker. You have trouble breathing. Summary Dermatitis is redness, soreness, and swelling of the skin. Symptoms may occur where the irritant has touched you. Treatment may include medicines and skin care. If you do not  know what caused your reaction, keep a journal. Contact a doctor if your condition gets worse or you have signs of infection. This information is not intended to replace advice given to you by  your health care provider. Make sure you discuss any questions you have with your health care provider. Document Revised: 01/22/2021 Document Reviewed: 01/22/2021 Elsevier Patient Education  2023 Elsevier Inc. Hypertension, Adult Hypertension is another name for high blood pressure. High blood pressure forces your heart to work harder to pump blood. This can cause problems over time. There are two numbers in a blood pressure reading. There is a top number (systolic) over a bottom number (diastolic). It is best to have a blood pressure that is below 120/80. What are the causes? The cause of this condition is not known. Some other conditions can lead to high blood pressure. What increases the risk? Some lifestyle factors can make you more likely to develop high blood pressure: Smoking. Not getting enough exercise or physical activity. Being overweight. Having too much fat, sugar, calories, or salt (sodium) in your diet. Drinking too much alcohol. Other risk factors include: Having any of these conditions: Heart disease. Diabetes. High cholesterol. Kidney disease. Obstructive sleep apnea. Having a family history of high blood pressure and high cholesterol. Age. The risk increases with age. Stress. What are the signs or symptoms? High blood pressure may not cause symptoms. Very high blood pressure (hypertensive crisis) may cause: Headache. Fast or uneven heartbeats (palpitations). Shortness of breath. Nosebleed. Vomiting or feeling like you may vomit (nauseous). Changes in how you see. Very bad chest pain. Feeling dizzy. Seizures. How is this treated? This condition is treated by making healthy lifestyle changes, such as: Eating healthy foods. Exercising more. Drinking less alcohol. Your doctor may prescribe medicine if lifestyle changes do not help enough and if: Your top number is above 130. Your bottom number is above 80. Your personal target blood pressure may  vary. Follow these instructions at home: Eating and drinking  If told, follow the DASH eating plan. To follow this plan: Fill one half of your plate at each meal with fruits and vegetables. Fill one fourth of your plate at each meal with whole grains. Whole grains include whole-wheat pasta, brown rice, and whole-grain bread. Eat or drink low-fat dairy products, such as skim milk or low-fat yogurt. Fill one fourth of your plate at each meal with low-fat (lean) proteins. Low-fat proteins include fish, chicken without skin, eggs, beans, and tofu. Avoid fatty meat, cured and processed meat, or chicken with skin. Avoid pre-made or processed food. Limit the amount of salt in your diet to less than 1,500 mg each day. Do not drink alcohol if: Your doctor tells you not to drink. You are pregnant, may be pregnant, or are planning to become pregnant. If you drink alcohol: Limit how much you have to: 0-1 drink a day for women. 0-2 drinks a day for men. Know how much alcohol is in your drink. In the [place] one drink equals one 12 oz bottle of beer (355 mL), one 5 oz glass of wine (148 mL), or one 1 oz glass of hard liquor (44 mL). Lifestyle  Work with your doctor to stay at a healthy weight or to lose weight. Ask your doctor what the best weight is for you. Get at least 30 minutes of exercise that causes your heart to beat faster (aerobic exercise) most days of the week. This may include walking, swimming, or biking. Get at least  30 minutes of exercise that strengthens your muscles (resistance exercise) at least 3 days a week. This may include lifting weights or doing Pilates. Do not smoke or use any products that contain nicotine or tobacco. If you need help quitting, ask your doctor. Check your blood pressure at home as told by your doctor. Keep all follow-up visits. Medicines Take over-the-counter and prescription medicines only as told by your doctor. Follow directions carefully. Do not skip  doses of blood pressure medicine. The medicine does not work as well if you skip doses. Skipping doses also puts you at risk for problems. Ask your doctor about side effects or reactions to medicines that you should watch for. Contact a doctor if: You think you are having a reaction to the medicine you are taking. You have headaches that keep coming back. You feel dizzy. You have swelling in your ankles. You have trouble with your vision. Get help right away if: You get a very bad headache. You start to feel mixed up (confused). You feel weak or numb. You feel faint. You have very bad pain in your: Chest. Belly (abdomen). You vomit more than once. You have trouble breathing. These symptoms may be an emergency. Get help right away. Call 911. Do not wait to see if the symptoms will go away. Do not drive yourself to the hospital. Summary Hypertension is another name for high blood pressure. High blood pressure forces your heart to work harder to pump blood. For most people, a normal blood pressure is less than 120/80. Making healthy choices can help lower blood pressure. If your blood pressure does not get lower with healthy choices, you may need to take medicine. This information is not intended to replace advice given to you by your health care provider. Make sure you discuss any questions you have with your health care provider. Document Revised: 01/25/2021 Document Reviewed: 01/25/2021 Elsevier Patient Education  2023 [employer].

## 2021-12-21 NOTE — Progress Notes (Unsigned)
Established Patient Office Visit  Subjective   Patient ID: Angela Roth, female    DOB: 1960/05/02  Age: 61 y.o. MRN: [id number]  Chief Complaint  Patient presents with   Medical Management of Chronic Issues    Rash This is a new problem. The current episode started in the past 7 days. The problem is unchanged. Location: Groin/genital area. The rash is characterized by burning, itchiness and redness. She was exposed to nothing. Pertinent negatives include no fatigue, fever, shortness of breath or sore throat. Past treatments include anti-itch cream. The treatment provided no relief.    Patient Active Problem List   Diagnosis Date Noted   Annual physical exam 12/31/2019   Epigastric pain 04/23/2018   Diarrhea 04/23/2018   Chest pain at rest 02/01/2016   Anxiety disorder 02/01/2016   Chronic prescription benzodiazepine use 02/01/2016   Atrial fibrillation with rapid ventricular response ([place]) 02/01/2016   Atrial fibrillation with RVR ([place]) 02/01/2016   Pure hypercholesterolemia    Gastroesophageal reflux disease 11/29/2015   CAD (coronary artery disease)    History of ST elevation myocardial infarction (STEMI)    LVF (left ventricular failure) ([place])    Right acute serous otitis media 08/16/2014   Hyperlipidemia 11/09/2009   Tobacco abuse 11/09/2009   Essential hypertension 11/09/2009   CHRONIC RHINITIS 11/09/2009   DYSPNEA 11/09/2009   Past Medical History:  Diagnosis Date   Arthritis    CAD (coronary artery disease)    a. inferior STEMI 10/2015 s/p DESx3 to RCA.   Chronic headaches    Chronic sinusitis    COPD (chronic obstructive pulmonary disease) ([facility])    Depression    Essential hypertension    Fibromyalgia    GERD (gastroesophageal reflux disease)    History of ST elevation myocardial infarction (STEMI)    Hyperlipidemia    Hypothyroidism    Irritable bowel syndrome (IBS) AGE 9   DIARRHEA PREDOMINANT   LVF (left ventricular failure) ([place])    a. Transient LV  failure during STEMI 10/2015 - EF 45-50% with f/u EF 55% by echo same adm.   Lymphocytic colitis 2010   Tobacco abuse    Past Surgical History:  Procedure Laterality Date   BACK SURGERY     CARDIAC CATHETERIZATION N/A 11/18/2015   Procedure: Left Heart Cath and Coronary Angiography;  Surgeon: Belva Crome, MD;  Location: [facility];  Service: Cardiovascular;  Laterality: N/A;   CARDIAC CATHETERIZATION N/A 11/18/2015   Procedure: Coronary Stent Intervention;  Surgeon: Belva Crome, MD;  Location: [facility];  Service: Cardiovascular;  Laterality: N/A;   CARDIAC CATHETERIZATION N/A 02/01/2016   Procedure: Left Heart Cath and Coronary Angiography;  Surgeon: Belva Crome, MD;  Location: [facility];  Service: Cardiovascular;  Laterality: N/A;   CARDIAC CATHETERIZATION N/A 02/01/2016   Procedure: Intravascular Pressure Wire/FFR Study;  Surgeon: Belva Crome, MD;  Location: [facility];  Service: Cardiovascular;  Laterality: N/A;   CESAREAN SECTION     CHOLECYSTECTOMY     EYE SURGERY     FOOT SURGERY     left fooot = x3    TOTAL ABDOMINAL HYSTERECTOMY W/ BILATERAL SALPINGOOPHORECTOMY     Social History   Tobacco Use   Smoking status: Every Day    Packs/day: 1.00    Years: 42.00    Total pack years: 42.00    Types: Cigarettes    Start date: 09/12/1971   Smokeless tobacco: Never  Vaping Use  Vaping Use: Never used  Substance Use Topics   Alcohol use: No    Alcohol/week: 0.0 standard drinks of alcohol   Drug use: No   Social History   Socioeconomic History   Marital status: Married    Spouse name: Gwyndolyn Saxon    Number of children: 3   Years of education: Not on file   Highest education level: Not on file  Occupational History   Occupation: disabled   Tobacco Use   Smoking status: Every Day    Packs/day: 1.00    Years: 42.00    Total pack years: 42.00    Types: Cigarettes    Start date: 09/12/1971   Smokeless tobacco: Never  Vaping Use    Vaping Use: Never used  Substance and Sexual Activity   Alcohol use: No    Alcohol/week: 0.0 standard drinks of alcohol   Drug use: No   Sexual activity: Yes    Partners: Male  Other Topics Concern   Not on file  Social History Narrative   [place] Benefits       WAS A MED [place]. GOT OUT BECAUSE SHE INJURED HER BACK. THREE KIDS AND MARRIED. SPENDS FREE TIME:  NOTHING BECAUSE HAD CATARACTS AND WAS BLIND. IS A HOME BODY DUE TO PTSD/ANXIETY AND BOWELS. MARRIED FOR PAST 5-6 YRS. DIDN'T WANT TO GROW OLD ALONE AND NEEDED FATHER FOR HER SON & HAS 2 CATS, 3 DOGS.         [place]: [place]. BEEN IN [place] FOR 10 YRS.   Social Determinants of Health   Financial Resource Strain: Medium Risk (06/08/2021)   Overall Financial Resource Strain (CARDIA)    Difficulty of Paying Living Expenses: Somewhat hard  Food Insecurity: Food Insecurity Present (06/08/2021)   Hunger Vital Sign    Worried About Running Out of Food in the Last Year: Sometimes true    Ran Out of Food in the Last Year: Never true  Transportation Needs: No Transportation Needs (06/08/2021)   PRAPARE - Hydrologist (Medical): No    Lack of Transportation (Non-Medical): No  Physical Activity: Inactive (06/08/2021)   Exercise Vital Sign    Days of Exercise per Week: 0 days    Minutes of Exercise per Session: 0 min  Stress: Stress Concern Present (06/08/2021)   [place]    Feeling of Stress : To some extent  Social Connections: Moderately Isolated (06/08/2021)   Social Connection and Isolation Panel [NHANES]    Frequency of Communication with Friends and Family: More than three times a week    Frequency of Social Gatherings with Friends and Family: Once a week    Attends Religious Services: Never    Marine scientist or Organizations: No    Attends Archivist Meetings: Never    Marital  Status: Married  Human resources officer Violence: Not At Risk (06/08/2021)   Humiliation, Afraid, Rape, and Kick questionnaire    Fear of Current or Ex-Partner: No    Emotionally Abused: No    Physically Abused: No    Sexually Abused: No   Family Status  Relation Name Status   MGM  (Not Specified)   Brother Chief Operating Officer   Mother  Deceased   Father  Deceased   Sister  Alive   Daughter  Alive   Son  [place] Deceased  murdered    Son  Alive   Neg Hx  (Not Specified)   Family History  Problem Relation Age of Onset   Lung cancer Maternal Grandmother    Ovarian cancer Maternal Grandmother    Asthma Brother    Hypertension Brother    Stomach cancer Mother    Colon polyps Mother    Cancer Mother        non hodg. lymphoma    Cancer Father        lung cancer / black lung    Suicidality Father    Thyroid disease Sister    Irritable bowel syndrome Son    Colon cancer Neg Hx    Allergies  Allergen Reactions   Indomethacin Other (See Comments)    Patient has bad side effects "suicidal thoughts/homicidal thoughts"   Prednisone     [place], contributed to cataracts       Review of Systems  Constitutional: Negative.  Negative for fatigue and fever.  HENT: Negative.  Negative for sore throat.   Eyes: Negative.   Respiratory: Negative.  Negative for shortness of breath.   Cardiovascular: Negative.   Genitourinary: Negative.   Skin:  Positive for itching and rash.  Neurological: Negative.   Psychiatric/Behavioral: Negative.    All other systems reviewed and are negative.     Objective:     BP 107/74   Pulse 86   Temp 97.6 F (36.4 C) (Temporal)   Resp 20   Ht 5' 2"  (1.575 m)   Wt 149 lb (67.6 kg)   SpO2 96%   BMI 27.25 kg/m  BP Readings from Last 3 Encounters:  12/21/21 107/74  06/01/21 115/81  12/31/19 134/80   Wt Readings from Last 3 Encounters:  12/21/21 149 lb (67.6 kg)  06/08/21 157 lb (71.2 kg)  06/01/21 157 lb (71.2 kg)       Physical Exam Vitals and nursing note reviewed.  Constitutional:      Appearance: Normal appearance. She is normal weight.  HENT:     Head: Normocephalic.     Right Ear: External ear normal.     Left Ear: External ear normal.     Nose: Nose normal.     Mouth/Throat:     Mouth: Mucous membranes are moist.     Pharynx: Oropharynx is clear.  Eyes:     Conjunctiva/sclera: Conjunctivae normal.  Cardiovascular:     Rate and Rhythm: Normal rate and regular rhythm.     Pulses: Normal pulses.     Heart sounds: Normal heart sounds.  Pulmonary:     Effort: Pulmonary effort is normal.     Breath sounds: Normal breath sounds.  Abdominal:     General: Bowel sounds are normal.  Skin:    General: Skin is warm.     Findings: Erythema and rash present. Rash is purpuric.          Comments: Tinea cruris  Neurological:     General: No focal deficit present.     Mental Status: She is alert and oriented to person, place, and time.      Results for orders placed or performed in visit on 12/21/21  CBC with Differential  Result Value Ref Range   WBC 11.3 (H) 3.4 - 10.8 x10E3/uL   RBC 4.72 3.77 - 5.28 x10E6/uL   Hemoglobin 15.2 11.1 - 15.9 g/dL   Hematocrit 44.5 34.0 - 46.6 %   MCV 94 79 - 97 fL   MCH 32.2 26.6 -  33.0 pg   MCHC 34.2 31.5 - 35.7 g/dL   RDW 13.1 11.7 - 15.4 %   Platelets 343 150 - 450 x10E3/uL   Neutrophils 52 Not Estab. %   Lymphs 30 Not Estab. %   Monocytes 9 Not Estab. %   Eos 7 Not Estab. %   Basos 1 Not Estab. %   Neutrophils Absolute 5.9 1.4 - 7.0 x10E3/uL   Lymphocytes Absolute 3.4 (H) 0.7 - 3.1 x10E3/uL   Monocytes Absolute 1.0 (H) 0.1 - 0.9 x10E3/uL   EOS (ABSOLUTE) 0.8 (H) 0.0 - 0.4 x10E3/uL   Basophils Absolute 0.1 0.0 - 0.2 x10E3/uL   Immature Granulocytes 1 Not Estab. %   Immature Grans (Abs) 0.1 0.0 - 0.1 x10E3/uL  CMP14+EGFR  Result Value Ref Range   Glucose 98 70 - 99 mg/dL   BUN 15 8 - 27 mg/dL   Creatinine, Ser 0.68 0.57 - 1.00 mg/dL   eGFR  98 >59 mL/min/1.73   BUN/Creatinine Ratio 22 12 - 28   Sodium 135 134 - 144 mmol/L   Potassium 4.3 3.5 - 5.2 mmol/L   Chloride 100 96 - 106 mmol/L   CO2 20 20 - 29 mmol/L   Calcium 10.2 8.7 - 10.3 mg/dL   Total Protein 7.4 6.0 - 8.5 g/dL   Albumin 4.9 3.9 - 4.9 g/dL   Globulin, Total 2.5 1.5 - 4.5 g/dL   Albumin/Globulin Ratio 2.0 1.2 - 2.2   Bilirubin Total 0.3 0.0 - 1.2 mg/dL   Alkaline Phosphatase 95 44 - 121 IU/L   AST 16 0 - 40 IU/L   ALT 15 0 - 32 IU/L  Lipid Panel  Result Value Ref Range   Cholesterol, Total 163 100 - 199 mg/dL   Triglycerides 322 (H) 0 - 149 mg/dL   HDL 39 (L) >39 mg/dL   VLDL Cholesterol Cal 51 (H) 5 - 40 mg/dL   LDL Chol Calc (NIH) 73 0 - 99 mg/dL   Chol/HDL Ratio 4.2 0.0 - 4.4 ratio  TSH  Result Value Ref Range   TSH 1.710 0.450 - 4.500 uIU/mL    Last CBC Lab Results  Component Value Date   WBC 11.3 (H) 12/21/2021   HGB 15.2 12/21/2021   HCT 44.5 12/21/2021   MCV 94 12/21/2021   MCH 32.2 12/21/2021   RDW 13.1 12/21/2021   PLT 343 73/22/0254   Last metabolic panel Lab Results  Component Value Date   GLUCOSE 98 12/21/2021   NA 135 12/21/2021   K 4.3 12/21/2021   CL 100 12/21/2021   CO2 20 12/21/2021   BUN 15 12/21/2021   CREATININE 0.68 12/21/2021   EGFR 98 12/21/2021   CALCIUM 10.2 12/21/2021   PROT 7.4 12/21/2021   ALBUMIN 4.9 12/21/2021   LABGLOB 2.5 12/21/2021   AGRATIO 2.0 12/21/2021   BILITOT 0.3 12/21/2021   ALKPHOS 95 12/21/2021   AST 16 12/21/2021   ALT 15 12/21/2021   ANIONGAP 9 02/02/2016   Last lipids Lab Results  Component Value Date   CHOL 163 12/21/2021   HDL 39 (L) 12/21/2021   LDLCALC 73 12/21/2021   TRIG 322 (H) 12/21/2021   CHOLHDL 4.2 12/21/2021   Last thyroid functions Lab Results  Component Value Date   TSH 1.710 12/21/2021      The 10-year ASCVD risk score (Arnett DK, et al., 2019) is: 8%    Assessment & Plan:  Patient's rash present as tinea cruris,/yeast.  Advised patient to wash all  undergarment  in hot water and dry on high heat.  Avoid moisture, avoid scratching area with fingernails, clotrimazole topical cream, Diflucan 150 mg tablet by mouth.  Follow-up for worsening or unresolved symptoms.   Problem List Items Addressed This Visit       Cardiovascular and Mediastinum   Essential hypertension - Primary    Blood pressure well controlled on current medication no changes necessary.  Labs completed CBC, CMP lipid panel and TSH.      Relevant Medications   carvedilol (COREG) 12.5 MG tablet   Other Relevant Orders   CBC with Differential (Completed)   CMP14+EGFR (Completed)   Lipid Panel (Completed)   TSH (Completed)   CAD (coronary artery disease)   Relevant Medications   carvedilol (COREG) 12.5 MG tablet   Other Relevant Orders   DG Chest 2 View     Other   Hyperlipidemia    Labs completed results pending.      Relevant Medications   carvedilol (COREG) 12.5 MG tablet   Other Visit Diagnoses     Tinea cruris       Relevant Medications   clotrimazole (ANTIFUNGAL, CLOTRIMAZOLE,) 1 % cream   fluconazole (DIFLUCAN) 150 MG tablet       Return in about 6 months (around 06/21/2022) for chronic disease management.    Ivy Lynn, NP

## 2021-12-22 LAB — LIPID PANEL
Chol/HDL Ratio: 4.2 ratio (ref 0.0–4.4)
Cholesterol, Total: 163 mg/dL (ref 100–199)
HDL: 39 mg/dL — ABNORMAL LOW (ref >39)
LDL Chol Calc (NIH): 73 mg/dL (ref 0–99)
Triglycerides: 322 mg/dL — ABNORMAL HIGH (ref 0–149)
VLDL Cholesterol Cal: 51 mg/dL — ABNORMAL HIGH (ref 5–40)

## 2021-12-22 LAB — CMP14+EGFR
ALT: 15 IU/L (ref 0–32)
AST: 16 IU/L (ref 0–40)
Albumin/Globulin Ratio: 2 (ref 1.2–2.2)
Albumin: 4.9 g/dL (ref 3.9–4.9)
Alkaline Phosphatase: 95 IU/L (ref 44–121)
BUN/Creatinine Ratio: 22 (ref 12–28)
BUN: 15 mg/dL (ref 8–27)
Bilirubin Total: 0.3 mg/dL (ref 0.0–1.2)
CO2: 20 mmol/L (ref 20–29)
Calcium: 10.2 mg/dL (ref 8.7–10.3)
Chloride: 100 mmol/L (ref 96–106)
Creatinine, Ser: 0.68 mg/dL (ref 0.57–1.00)
Globulin, Total: 2.5 g/dL (ref 1.5–4.5)
Glucose: 98 mg/dL (ref 70–99)
Potassium: 4.3 mmol/L (ref 3.5–5.2)
Sodium: 135 mmol/L (ref 134–144)
Total Protein: 7.4 g/dL (ref 6.0–8.5)
eGFR: 98 mL/min/{1.73_m2} (ref >59)

## 2021-12-22 LAB — CBC WITH DIFFERENTIAL/PLATELET
Basophils Absolute: 0.1 10*3/uL (ref 0.0–0.2)
Basos: 1 %
EOS (ABSOLUTE): 0.8 10*3/uL — ABNORMAL HIGH (ref 0.0–0.4)
Eos: 7 %
Hematocrit: 44.5 % (ref 34.0–46.6)
Hemoglobin: 15.2 g/dL (ref 11.1–15.9)
Immature Grans (Abs): 0.1 10*3/uL (ref 0.0–0.1)
Immature Granulocytes: 1 %
Lymphocytes Absolute: 3.4 10*3/uL — ABNORMAL HIGH (ref 0.7–3.1)
Lymphs: 30 %
MCH: 32.2 pg (ref 26.6–33.0)
MCHC: 34.2 g/dL (ref 31.5–35.7)
MCV: 94 fL (ref 79–97)
Monocytes Absolute: 1 10*3/uL — ABNORMAL HIGH (ref 0.1–0.9)
Monocytes: 9 %
Neutrophils Absolute: 5.9 10*3/uL (ref 1.4–7.0)
Neutrophils: 52 %
Platelets: 343 10*3/uL (ref 150–450)
RBC: 4.72 x10E6/uL (ref 3.77–5.28)
RDW: 13.1 % (ref 11.7–15.4)
WBC: 11.3 10*3/uL — ABNORMAL HIGH (ref 3.4–10.8)

## 2021-12-22 LAB — TSH: TSH: 1.71 u[IU]/mL (ref 0.450–4.500)

## 2021-12-23 NOTE — Progress Notes (Incomplete)
Established Patient Office Visit  Subjective   Patient ID: Angela Roth, female    DOB: 1960/10/08  Age: 61 y.o. MRN: [id number]  Chief Complaint  Patient presents with  . Medical Management of Chronic Issues    HPI  Patient Active Problem List   Diagnosis Date Noted  . Annual physical exam 12/31/2019  . Epigastric pain 04/23/2018  . Diarrhea 04/23/2018  . Chest pain at rest 02/01/2016  . Anxiety disorder 02/01/2016  . Chronic prescription benzodiazepine use 02/01/2016  . Atrial fibrillation with rapid ventricular response ([place]) 02/01/2016  . Atrial fibrillation with RVR ([place]) 02/01/2016  . Pure hypercholesterolemia   . Gastroesophageal reflux disease 11/29/2015  . CAD (coronary artery disease)   . History of ST elevation myocardial infarction (STEMI)   . LVF (left ventricular failure) ([facility])   . Right acute serous otitis media 08/16/2014  . Hyperlipidemia 11/09/2009  . Tobacco abuse 11/09/2009  . Essential hypertension 11/09/2009  . CHRONIC RHINITIS 11/09/2009  . DYSPNEA 11/09/2009   Past Medical History:  Diagnosis Date  . Arthritis   . CAD (coronary artery disease)    a. inferior STEMI 10/2015 s/p DESx3 to RCA.  Marland Kitchen Chronic headaches   . Chronic sinusitis   . COPD (chronic obstructive pulmonary disease) ([place])   . Depression   . Essential hypertension   . Fibromyalgia   . GERD (gastroesophageal reflux disease)   . History of ST elevation myocardial infarction (STEMI)   . Hyperlipidemia   . Hypothyroidism   . Irritable bowel syndrome (IBS) AGE 67   DIARRHEA PREDOMINANT  . LVF (left ventricular failure) ([place])    a. Transient LV failure during STEMI 10/2015 - EF 45-50% with f/u EF 55% by echo same adm.  . Lymphocytic colitis 2010  . Tobacco abuse    Past Surgical History:  Procedure Laterality Date  . BACK SURGERY    . CARDIAC CATHETERIZATION N/A 11/18/2015   Procedure: Left Heart Cath and Coronary Angiography;  Surgeon: Belva Crome, MD;  Location: [facility];  Service: Cardiovascular;  Laterality: N/A;  . CARDIAC CATHETERIZATION N/A 11/18/2015   Procedure: Coronary Stent Intervention;  Surgeon: Belva Crome, MD;  Location: [facility];  Service: Cardiovascular;  Laterality: N/A;  . CARDIAC CATHETERIZATION N/A 02/01/2016   Procedure: Left Heart Cath and Coronary Angiography;  Surgeon: Belva Crome, MD;  Location: [facility];  Service: Cardiovascular;  Laterality: N/A;  . CARDIAC CATHETERIZATION N/A 02/01/2016   Procedure: Intravascular Pressure Wire/FFR Study;  Surgeon: Belva Crome, MD;  Location: [facility];  Service: Cardiovascular;  Laterality: N/A;  . CESAREAN SECTION    . CHOLECYSTECTOMY    . EYE SURGERY    . FOOT SURGERY     left fooot = x3   . TOTAL ABDOMINAL HYSTERECTOMY W/ BILATERAL SALPINGOOPHORECTOMY     Social History   Tobacco Use  . Smoking status: Every Day    Packs/day: 1.00    Years: 42.00    Total pack years: 42.00    Types: Cigarettes    Start date: 09/12/1971  . Smokeless tobacco: Never  Vaping Use  . Vaping Use: Never used  Substance Use Topics  . Alcohol use: No    Alcohol/week: 0.0 standard drinks of alcohol  . Drug use: No   Social History   Socioeconomic History  . Marital status: Married    Spouse name: Gwyndolyn Saxon   . Number of children: 3  . Years of  education: Not on file  . Highest education level: Not on file  Occupational History  . Occupation: disabled   Tobacco Use  . Smoking status: Every Day    Packs/day: 1.00    Years: 42.00    Total pack years: 42.00    Types: Cigarettes    Start date: 09/12/1971  . Smokeless tobacco: Never  Vaping Use  . Vaping Use: Never used  Substance and Sexual Activity  . Alcohol use: No    Alcohol/week: 0.0 standard drinks of alcohol  . Drug use: No  . Sexual activity: Yes    Partners: Male  Other Topics Concern  . Not on file  Social History Narrative   [employer] [place]       [place]CH [place] [employer]. SERVED FOR ALMOST 13  YRS. GOT OUT BECAUSE SHE INJURED HER BACK. THREE KIDS AND MARRIED. SPENDS FREE TIME:  NOTHING BECAUSE HAD CATARACTS AND WAS BLIND. IS A HOME BODY DUE TO PTSD/ANXIETY AND BOWELS. MARRIED FOR PAST 5-6 YRS. DIDN'T WANT TO GROW OLD ALONE AND NEEDED FATHER FOR HER SON & HAS 2 CATS, 3 DOGS.         [place]: [place]. BEEN IN [place] FOR 10 YRS.   Social Determinants of Health   Financial Resource Strain: Medium Risk (06/08/2021)   Overall Financial Resource Strain (CARDIA)   . Difficulty of Paying Living Expenses: Somewhat hard  Food Insecurity: Food Insecurity Present (06/08/2021)   Hunger Vital Sign   . Worried About Charity fundraiser in the Last Year: Sometimes true   . Ran Out of Food in the Last Year: Never true  Transportation Needs: No Transportation Needs (06/08/2021)   PRAPARE - Transportation   . Lack of Transportation (Medical): No   . Lack of Transportation (Non-Medical): No  Physical Activity: Inactive (06/08/2021)   Exercise Vital Sign   . Days of Exercise per Week: 0 days   . Minutes of Exercise per Session: 0 min  Stress: Stress Concern Present (06/08/2021)   [place]   . Feeling of Stress : To some extent  Social Connections: Moderately Isolated (06/08/2021)   Social Connection and Isolation Panel [NHANES]   . Frequency of Communication with Friends and Family: More than three times a week   . Frequency of Social Gatherings with Friends and Family: Once a week   . Attends Religious Services: Never   . Active Member of Clubs or Organizations: No   . Attends Archivist Meetings: Never   . Marital Status: Married  Human resources officer Violence: Not At Risk (06/08/2021)   Humiliation, Afraid, Rape, and Kick questionnaire   . Fear of Current or Ex-Partner: No   . Emotionally Abused: No   . Physically Abused: No   . Sexually Abused: No   Family Status  Relation Name Status  . MGM  (Not  Specified)  . Brother Peter Kiewit Sons  . Mother  Deceased  . Father  Deceased  . Sister  Alive  . Daughter  Alive  . Son  Alive  . Brother Timmy Deceased       murdered   . Son  Alive  . Neg Hx  (Not Specified)   Family History  Problem Relation Age of Onset  . Lung cancer Maternal Grandmother   . Ovarian cancer Maternal Grandmother   . Asthma Brother   . Hypertension Brother   . Stomach cancer Mother   .  Colon polyps Mother   . Cancer Mother        non hodg. lymphoma   . Cancer Father        lung cancer / black lung   . Suicidality Father   . Thyroid disease Sister   . Irritable bowel syndrome Son   . Colon cancer Neg Hx    Allergies  Allergen Reactions  . Indomethacin Other (See Comments)    Patient has bad side effects "suicidal thoughts/homicidal thoughts"  . Prednisone     Makes Mean, contributed to cataracts       Review of Systems  Constitutional: Negative.   HENT: Negative.    Eyes: Negative.   Respiratory: Negative.    Cardiovascular: Negative.   Genitourinary: Negative.   Skin: Negative.  Negative for itching and rash.  Neurological: Negative.   Psychiatric/Behavioral: Negative.    All other systems reviewed and are negative.     Objective:     BP 107/74   Pulse 86   Temp 97.6 F (36.4 C) (Temporal)   Resp 20   Ht 5' 2"  (1.575 m)   Wt 149 lb (67.6 kg)   SpO2 96%   BMI 27.25 kg/m  BP Readings from Last 3 Encounters:  12/21/21 107/74  06/01/21 115/81  12/31/19 134/80   Wt Readings from Last 3 Encounters:  12/21/21 149 lb (67.6 kg)  06/08/21 157 lb (71.2 kg)  06/01/21 157 lb (71.2 kg)      Physical Exam Vitals and nursing note reviewed.  Constitutional:      Appearance: Normal appearance. She is normal weight.  HENT:     Head: Normocephalic.     Right Ear: External ear normal.     Left Ear: External ear normal.     Nose: Nose normal.     Mouth/Throat:     Mouth: Mucous membranes are moist.     Pharynx: Oropharynx is clear.   Eyes:     Conjunctiva/sclera: Conjunctivae normal.  Cardiovascular:     Rate and Rhythm: Normal rate and regular rhythm.     Pulses: Normal pulses.     Heart sounds: Normal heart sounds.  Pulmonary:     Effort: Pulmonary effort is normal.     Breath sounds: Normal breath sounds.  Abdominal:     General: Bowel sounds are normal.  Skin:    General: Skin is warm.     Findings: No erythema or rash.  Neurological:     General: No focal deficit present.     Mental Status: She is alert and oriented to person, place, and time.      No results found for any visits on 12/21/21.  Last CBC Lab Results  Component Value Date   WBC 11.3 (H) 12/21/2021   HGB 15.2 12/21/2021   HCT 44.5 12/21/2021   MCV 94 12/21/2021   MCH 32.2 12/21/2021   RDW 13.1 12/21/2021   PLT 343 03/47/4259   Last metabolic panel Lab Results  Component Value Date   GLUCOSE 98 12/21/2021   NA 135 12/21/2021   K 4.3 12/21/2021   CL 100 12/21/2021   CO2 20 12/21/2021   BUN 15 12/21/2021   CREATININE 0.68 12/21/2021   EGFR 98 12/21/2021   CALCIUM 10.2 12/21/2021   PROT 7.4 12/21/2021   ALBUMIN 4.9 12/21/2021   LABGLOB 2.5 12/21/2021   AGRATIO 2.0 12/21/2021   BILITOT 0.3 12/21/2021   ALKPHOS 95 12/21/2021   AST 16 12/21/2021   ALT 15 12/21/2021  ANIONGAP 9 02/02/2016   Last lipids Lab Results  Component Value Date   CHOL 163 12/21/2021   HDL 39 (L) 12/21/2021   LDLCALC 73 12/21/2021   TRIG 322 (H) 12/21/2021   CHOLHDL 4.2 12/21/2021   Last thyroid functions Lab Results  Component Value Date   TSH 1.710 12/21/2021      The 10-year ASCVD risk score (Arnett DK, et al., 2019) is: 11.6%    Assessment & Plan:   Problem List Items Addressed This Visit       Cardiovascular and Mediastinum   CAD (coronary artery disease) - Primary   Relevant Medications   carvedilol (COREG) 12.5 MG tablet   Other Relevant Orders   DG Chest 2 View    No follow-ups on file.    Ivy Lynn,  NP

## 2021-12-23 NOTE — Assessment & Plan Note (Signed)
Labs completed results pending.  

## 2021-12-23 NOTE — Assessment & Plan Note (Signed)
Blood pressure well controlled on current medication no changes necessary.  Labs completed CBC, CMP lipid panel and TSH.

## 2022-01-08 ENCOUNTER — Other Ambulatory Visit: Payer: Self-pay | Admitting: *Deleted

## 2022-01-08 DIAGNOSIS — E782 Mixed hyperlipidemia: Secondary | ICD-10-CM

## 2022-01-08 MED ORDER — ROSUVASTATIN CALCIUM 10 MG PO TABS: 10.0000 mg | ORAL_TABLET | Freq: Every day | ORAL | 1 refills | Status: DC

## 2022-01-08 MED ORDER — LISINOPRIL 10 MG PO TABS: 10.0000 mg | ORAL_TABLET | Freq: Every day | ORAL | 1 refills | Status: DC

## 2022-01-18 ENCOUNTER — Other Ambulatory Visit: Payer: Self-pay | Admitting: Nurse Practitioner

## 2022-01-25 ENCOUNTER — Other Ambulatory Visit: Payer: Self-pay | Admitting: Nurse Practitioner

## 2022-01-25 DIAGNOSIS — E782 Mixed hyperlipidemia: Secondary | ICD-10-CM

## 2022-02-04 ENCOUNTER — Other Ambulatory Visit: Payer: Self-pay | Admitting: Nurse Practitioner

## 2022-02-04 DIAGNOSIS — B356 Tinea cruris: Secondary | ICD-10-CM

## 2022-02-18 ENCOUNTER — Other Ambulatory Visit: Payer: Self-pay | Admitting: Nurse Practitioner

## 2022-03-23 ENCOUNTER — Other Ambulatory Visit: Payer: Self-pay | Admitting: Nurse Practitioner

## 2022-03-26 ENCOUNTER — Other Ambulatory Visit: Payer: Self-pay | Admitting: Nurse Practitioner

## 2022-03-26 NOTE — Telephone Encounter (Signed)
Pt aware refills sent to pharmacy. Appt made for 06/26/22 Pt also had question about taking Dicyclomine that was filled in Sept, she has not taken it yet since she was reading on the contraindications w/ heart and GI issues. IN looking at her chart, this was originally prescribed by her GI doctor 04/23/2018 which was after her heart cath in 2017. Instructed her since the GI provider prescribed it after the cath to go ahead with taking it, if she did feel she had any issues to let us know. 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

## 2022-04-26 ENCOUNTER — Other Ambulatory Visit: Payer: Self-pay | Admitting: Nurse Practitioner

## 2022-04-26 DIAGNOSIS — B356 Tinea cruris: Secondary | ICD-10-CM

## 2022-04-26 DIAGNOSIS — E782 Mixed hyperlipidemia: Secondary | ICD-10-CM

## 2022-04-30 ENCOUNTER — Other Ambulatory Visit: Payer: Self-pay | Admitting: Nurse Practitioner

## 2022-05-02 ENCOUNTER — Encounter: Payer: Self-pay | Admitting: *Deleted

## 2022-05-27 ENCOUNTER — Other Ambulatory Visit: Payer: Self-pay | Admitting: *Deleted

## 2022-05-27 DIAGNOSIS — B356 Tinea cruris: Secondary | ICD-10-CM

## 2022-06-10 ENCOUNTER — Ambulatory Visit (INDEPENDENT_AMBULATORY_CARE_PROVIDER_SITE_OTHER): Payer: Medicare Other

## 2022-06-10 VITALS — Ht 62.0 in | Wt 149.0 lb

## 2022-06-10 DIAGNOSIS — Z0000 Encounter for general adult medical examination without abnormal findings: Secondary | ICD-10-CM | POA: Diagnosis not present

## 2022-06-10 NOTE — Patient Instructions (Addendum)
Angela Roth , Thank you for taking time to come for your Medicare Wellness Visit. I appreciate your ongoing commitment to your health goals. Please review the following plan we discussed and let me know if I can assist you in the future.   These are the goals we discussed:  Goals      Quit Smoking        This is a list of the screening recommended for you and due dates:  Health Maintenance  Topic Date Due   COVID-19 Vaccine (1) Never done   HIV Screening  Never done   Hepatitis C Screening: USPSTF Recommendation to screen - Ages 94-79 yo.  Never done   DTaP/Tdap/Td vaccine (1 - Tdap) Never done   Zoster (Shingles) Vaccine (1 of 2) Never done   Pap Smear  Never done   Colon Cancer Screening  Never done   Screening for Lung Cancer  Never done   Mammogram  01/03/2020   Flu Shot  07/21/2022*   Medicare Annual Wellness Visit  06/11/2023   HPV Vaccine  Aged Out  *Topic was postponed. The date shown is not the original due date.    Advanced directives: Advance directive discussed with you today. I have provided a copy for you to complete at home and have notarized. Once this is complete please bring a copy in to our office so we can scan it into your chart.   Conditions/risks identified: Aim for 30 minutes of exercise or brisk walking, 6-8 glasses of water, and 5 servings of fruits and vegetables each day.   Next appointment: Follow up in one year for your annual wellness visit.   Preventive Care 40-64 Years, Female Preventive care refers to lifestyle choices and visits with your health care provider that can promote health and wellness. What does preventive care include? A yearly physical exam. This is also called an annual well check. Dental exams once or twice a year. Routine eye exams. Ask your health care provider how often you should have your eyes checked. Personal lifestyle choices, including: Daily care of your teeth and gums. Regular physical activity. Eating a healthy  diet. Avoiding tobacco and drug use. Limiting alcohol use. Practicing safe sex. Taking low-dose aspirin daily starting at age 29. Taking vitamin and mineral supplements as recommended by your health care provider. What happens during an annual well check? The services and screenings done by your health care provider during your annual well check will depend on your age, overall health, lifestyle risk factors, and family history of disease. Counseling  Your health care provider may ask you questions about your: Alcohol use. Tobacco use. Drug use. Emotional well-being. Home and relationship well-being. Sexual activity. Eating habits. Work and work Statistician. Method of birth control. Menstrual cycle. Pregnancy history. Screening  You may have the following tests or measurements: Height, weight, and BMI. Blood pressure. Lipid and cholesterol levels. These may be checked every 5 years, or more frequently if you are over 51 years old. Skin check. Lung cancer screening. You may have this screening every year starting at age 52 if you have a 30-pack-year history of smoking and currently smoke or have quit within the past 15 years. Fecal occult blood test (FOBT) of the stool. You may have this test every year starting at age 25. Flexible sigmoidoscopy or colonoscopy. You may have a sigmoidoscopy every 5 years or a colonoscopy every 10 years starting at age 86. Hepatitis C blood test. Hepatitis B blood test. Sexually transmitted  disease (STD) testing. Diabetes screening. This is done by checking your blood sugar (glucose) after you have not eaten for a while (fasting). You may have this done every 1-3 years. Mammogram. This may be done every 1-2 years. Talk to your health care provider about when you should start having regular mammograms. This may depend on whether you have a family history of breast cancer. BRCA-related cancer screening. This may be done if you have a family history of  breast, ovarian, tubal, or peritoneal cancers. Pelvic exam and Pap test. This may be done every 3 years starting at age 17. Starting at age 65, this may be done every 5 years if you have a Pap test in combination with an HPV test. Bone density scan. This is done to screen for osteoporosis. You may have this scan if you are at high risk for osteoporosis. Discuss your test results, treatment options, and if necessary, the need for more tests with your health care provider. Vaccines  Your health care provider may recommend certain vaccines, such as: Influenza vaccine. This is recommended every year. Tetanus, diphtheria, and acellular pertussis (Tdap, Td) vaccine. You may need a Td booster every 10 years. Zoster vaccine. You may need this after age 57. Pneumococcal 13-valent conjugate (PCV13) vaccine. You may need this if you have certain conditions and were not previously vaccinated. Pneumococcal polysaccharide (PPSV23) vaccine. You may need one or two doses if you smoke cigarettes or if you have certain conditions. Talk to your health care provider about which screenings and vaccines you need and how often you need them. This information is not intended to replace advice given to you by your health care provider. Make sure you discuss any questions you have with your health care provider. Document Released: 05/05/2015 Document Revised: 12/27/2015 Document Reviewed: 02/07/2015 Elsevier Interactive Patient Education  2017 [place] [employer] Prevention in the Home Falls can cause injuries. They can happen to people of all ages. There are many things you can do to make your home safe and to help prevent falls. What can I do on the outside of my home? Regularly fix the edges of walkways and driveways and fix any cracks. Remove anything that might make you trip as you walk through a door, such as a raised step or threshold. Trim any bushes or trees on the path to your home. Use bright outdoor  lighting. Clear any walking paths of anything that might make someone trip, such as rocks or tools. Regularly check to see if handrails are loose or broken. Make sure that both sides of any steps have handrails. Any raised decks and porches should have guardrails on the edges. Have any leaves, snow, or ice cleared regularly. Use sand or salt on walking paths during winter. Clean up any spills in your garage right away. This includes oil or grease spills. What can I do in the bathroom? Use night lights. Install grab bars by the toilet and in the tub and shower. Do not use towel bars as grab bars. Use non-skid mats or decals in the tub or shower. If you need to sit down in the shower, use a plastic, non-slip stool. Keep the floor dry. Clean up any water that spills on the floor as soon as it happens. Remove soap buildup in the tub or shower regularly. Attach bath mats securely with double-sided non-slip rug tape. Do not have throw rugs and other things on the floor that can make you trip. What can  I do in the bedroom? Use night lights. Make sure that you have a light by your bed that is easy to reach. Do not use any sheets or blankets that are too big for your bed. They should not hang down onto the floor. Have a firm chair that has side arms. You can use this for support while you get dressed. Do not have throw rugs and other things on the floor that can make you trip. What can I do in the kitchen? Clean up any spills right away. Avoid walking on wet floors. Keep items that you use a lot in easy-to-reach places. If you need to reach something above you, use a strong step stool that has a grab bar. Keep electrical cords out of the way. Do not use floor polish or wax that makes floors slippery. If you must use wax, use non-skid floor wax. Do not have throw rugs and other things on the floor that can make you trip. What can I do with my stairs? Do not leave any items on the stairs. Make  sure that there are handrails on both sides of the stairs and use them. Fix handrails that are broken or loose. Make sure that handrails are as long as the stairways. Check any carpeting to make sure that it is firmly attached to the stairs. Fix any carpet that is loose or worn. Avoid having throw rugs at the top or bottom of the stairs. If you do have throw rugs, attach them to the floor with carpet tape. Make sure that you have a light switch at the top of the stairs and the bottom of the stairs. If you do not have them, ask someone to add them for you. What else can I do to help prevent falls? Wear shoes that: Do not have high heels. Have rubber bottoms. Are comfortable and fit you well. Are closed at the toe. Do not wear sandals. If you use a stepladder: Make sure that it is fully opened. Do not climb a closed stepladder. Make sure that both sides of the stepladder are locked into place. Ask someone to hold it for you, if possible. Clearly mark and make sure that you can see: Any grab bars or handrails. First and last steps. Where the edge of each step is. Use tools that help you move around (mobility aids) if they are needed. These include: Canes. Walkers. Scooters. Crutches. Turn on the lights when you go into a dark area. Replace any light bulbs as soon as they burn out. Set up your furniture so you have a clear path. Avoid moving your furniture around. If any of your floors are uneven, fix them. If there are any pets around you, be aware of where they are. Review your medicines with your doctor. Some medicines can make you feel dizzy. This can increase your chance of falling. Ask your doctor what other things that you can do to help prevent falls. This information is not intended to replace advice given to you by your health care provider. Make sure you discuss any questions you have with your health care provider. Document Released: 02/02/2009 Document Revised: 09/14/2015  Document Reviewed: 05/13/2014 Elsevier Interactive Patient Education  2017 [employer].

## 2022-06-10 NOTE — Progress Notes (Signed)
Subjective:   Angela Roth is a 62 y.o. female who presents for Medicare Annual (Subsequent) preventive examination.  I connected with  Angela Roth on 06/10/22 by a audio enabled telemedicine application and verified that I am speaking with the correct person using two identifiers.  Patient Location: Home  Provider Location: Home Office  I discussed the limitations of evaluation and management by telemedicine. The patient expressed understanding and agreed to proceed.  Review of Systems     Cardiac Risk Factors include: dyslipidemia;hypertension;sedentary lifestyle;smoking/ tobacco exposure     Objective:    Today's Vitals   06/10/22 1548  Weight: 149 lb (67.6 kg)  Height: 5' 2"$  (1.575 m)   Body mass index is 27.25 kg/m.     06/10/2022    4:29 PM 06/08/2021    2:39 PM 02/01/2016    7:35 AM 11/18/2015    4:00 PM 01/30/2014    5:18 PM  Advanced Directives  Does Patient Have a Medical Advance Directive? No No No No No  Would patient like information on creating a medical advance directive? Yes (MAU/Ambulatory/Procedural Areas - Information given) No - Patient declined No - patient declined information Yes - Educational materials given No - patient declined information    Current Medications (verified) Outpatient Encounter Medications as of 06/10/2022  Medication Sig   acetaminophen (TYLENOL) 500 MG tablet Take 1,000 mg by mouth every 6 (six) hours as needed for headache.   albuterol (VENTOLIN HFA) 108 (90 Base) MCG/ACT inhaler INHALE TWO PUFFS BY MOUTH EVERY 4 TO 6 HOURS AS NEEDED FOR WHEEZING   alum & mag hydroxide-simeth (MAALOX/MYLANTA) 200-200-20 MG/5ML suspension Take 30 mLs by mouth every 6 (six) hours as needed for indigestion or heartburn.   aspirin EC 81 MG tablet Take 1 tablet (81 mg total) by mouth daily.   carvedilol (COREG) 25 MG tablet TAKE ONE TABLET BY MOUTH DAILY AT 9AM (VIAL)   clotrimazole (ANTIFUNGAL, CLOTRIMAZOLE,) 1 % cream Apply 1 Application  topically 2 (two) times daily.   dicyclomine (BENTYL) 10 MG capsule 1 PO 30 MINUTES PRIOR TO FIRST MEAL   fluconazole (DIFLUCAN) 150 MG tablet Take 1 tablet (150 mg total) by mouth every 7 (seven) days.   fluticasone (FLONASE) 50 MCG/ACT nasal spray INSTILL ONE SPRAY IN EACH NOSTRIL DAILY (BULK)   hydrOXYzine (VISTARIL) 25 MG capsule TAKE ONE CAPSULE BY MOUTH THREE TIMES DAILY AS NEEDED (VIAL)   lisinopril (ZESTRIL) 10 MG tablet TAKE ONE TABLET BY MOUTH DAILY AT 9AM (VIAL)   Melatonin 3 MG TABS Take 3 mg by mouth at bedtime as needed (sleep).    methocarbamol (ROBAXIN) 500 MG tablet TAKE ONE TABLET BY MOUTH DAILY AT 9PM AT BEDTIME   nitroGLYCERIN (NITROSTAT) 0.4 MG SL tablet Place 1 tablet (0.4 mg total) under the tongue every 5 (five) minutes as needed for chest pain.   VOLTAREN ARTHRITIS PAIN 1 % GEL APPLY TO THE AFFECTED AREA(S) FOUR TIMES DAILY AS NEEDED (BULK)   rosuvastatin (CRESTOR) 10 MG tablet TAKE ONE TABLET BY MOUTH DAILY AT 5PM (VIAL) (Patient not taking: Reported on 06/10/2022)   [DISCONTINUED] carvedilol (COREG) 12.5 MG tablet Take 12.5 mg by mouth daily. Take 1/2 tablet daily   No facility-administered encounter medications on file as of 06/10/2022.    Allergies (verified) Indomethacin and Prednisone   History: Past Medical History:  Diagnosis Date   Arthritis    CAD (coronary artery disease)    a. inferior STEMI 10/2015 s/p DESx3 to RCA.   Chronic headaches  Chronic sinusitis    COPD (chronic obstructive pulmonary disease) (HCC)    Depression    Essential hypertension    Fibromyalgia    GERD (gastroesophageal reflux disease)    History of ST elevation myocardial infarction (STEMI)    Hyperlipidemia    Hypothyroidism    Irritable bowel syndrome (IBS) AGE 24   DIARRHEA PREDOMINANT   LVF (left ventricular failure) ([place])    a. Transient LV failure during STEMI 10/2015 - EF 45-50% with f/u EF 55% by echo same adm.   Lymphocytic colitis 2010   Tobacco abuse    Past  Surgical History:  Procedure Laterality Date   BACK SURGERY     CARDIAC CATHETERIZATION N/A 11/18/2015   Procedure: Left Heart Cath and Coronary Angiography;  Surgeon: Belva Crome, MD;  Location: [place] [facility];  Service: Cardiovascular;  Laterality: N/A;   CARDIAC CATHETERIZATION N/A 11/18/2015   Procedure: Coronary Stent Intervention;  Surgeon: Belva Crome, MD;  Location: [place] [facility];  Service: Cardiovascular;  Laterality: N/A;   CARDIAC CATHETERIZATION N/A 02/01/2016   Procedure: Left Heart Cath and Coronary Angiography;  Surgeon: Belva Crome, MD;  Location: [place] [facility];  Service: Cardiovascular;  Laterality: N/A;   CARDIAC CATHETERIZATION N/A 02/01/2016   Procedure: Intravascular Pressure Wire/FFR Study;  Surgeon: Belva Crome, MD;  Location: [place] [facility];  Service: Cardiovascular;  Laterality: N/A;   CESAREAN SECTION     CHOLECYSTECTOMY     EYE SURGERY     FOOT SURGERY     left fooot = x3    TOTAL ABDOMINAL HYSTERECTOMY W/ BILATERAL SALPINGOOPHORECTOMY     Family History  Problem Relation Age of Onset   Lung cancer Maternal Grandmother    Ovarian cancer Maternal Grandmother    Asthma Brother    Hypertension Brother    Stomach cancer Mother    Colon polyps Mother    Cancer Mother        non hodg. lymphoma    Cancer Father        lung cancer / black lung    Suicidality Father    Thyroid disease Sister    Irritable bowel syndrome Son    Colon cancer Neg Hx    Social History   Socioeconomic History   Marital status: Married    Spouse name: Angela Roth    Number of children: 3   Years of education: Not on file   Highest education level: Not on file  Occupational History   Occupation: disabled   Tobacco Use   Smoking status: Every Day    Packs/day: 1.00    Years: 42.00    Total pack years: 42.00    Types: Cigarettes    Start date: 09/12/1971   Smokeless tobacco: Never  Vaping Use   Vaping Use: Never used  Substance and Sexual Activity    Alcohol use: No    Alcohol/week: 0.0 standard drinks of alcohol   Drug use: No   Sexual activity: Yes    Partners: Male  Other Topics Concern   Not on file  Social History Narrative   VA Benefits       WAS A MED [place]. GOT OUT BECAUSE SHE INJURED HER BACK. THREE KIDS AND MARRIED. SPENDS FREE TIME:  NOTHING BECAUSE HAD CATARACTS AND WAS BLIND. IS A HOME BODY DUE TO PTSD/ANXIETY AND BOWELS. MARRIED FOR PAST 5-6 YRS. DIDN'T WANT TO GROW OLD ALONE AND NEEDED  FATHER FOR HER SON & HAS 2 CATS, 3 DOGS.         [place]: [place]. BEEN IN [place] FOR 10 YRS.   Social Determinants of Health   Financial Resource Strain: Low Risk  (06/10/2022)   Overall Financial Resource Strain (CARDIA)    Difficulty of Paying Living Expenses: Not hard at all  Food Insecurity: No Food Insecurity (06/10/2022)   Hunger Vital Sign    Worried About Running Out of Food in the Last Year: Never true    Ran Out of Food in the Last Year: Never true  Transportation Needs: No Transportation Needs (06/10/2022)   PRAPARE - Hydrologist (Medical): No    Lack of Transportation (Non-Medical): No  Physical Activity: Inactive (06/10/2022)   Exercise Vital Sign    Days of Exercise per Week: 0 days    Minutes of Exercise per Session: 0 min  Stress: No Stress Concern Present (06/10/2022)   [place]    Feeling of Stress : Only a little  Social Connections: Moderately Isolated (06/10/2022)   Social Connection and Isolation Panel [NHANES]    Frequency of Communication with Friends and Family: More than three times a week    Frequency of Social Gatherings with Friends and Family: Once a week    Attends Religious Services: Never    Marine scientist or Organizations: No    Attends Music therapist: Never    Marital Status: Married    Tobacco Counseling Ready to quit: Not  Answered Counseling given: Not Answered   Clinical Intake:  Pre-visit preparation completed: Yes  Pain : No/denies pain  Diabetes: No  How often do you need to have someone help you when you read instructions, pamphlets, or other written materials from your doctor or pharmacy?: 1 - Never  Diabetic?No   Interpreter Needed?: No  Information entered by :: Denman George LPN   Activities of Daily Living    06/10/2022    4:29 PM  In your present state of health, do you have any difficulty performing the following activities:  Hearing? 0  Vision? 1  Difficulty concentrating or making decisions? 0  Walking or climbing stairs? 1  Dressing or bathing? 0  Doing errands, shopping? 1  Preparing Food and eating ? N  Using the Toilet? N  In the past six months, have you accidently leaked urine? N  Do you have problems with loss of bowel control? N  Managing your Medications? N  Managing your Finances? N  Housekeeping or managing your Housekeeping? Y    Patient Care Team: Gwenlyn Perking, FNP as PCP - General (Family Medicine)  Indicate any recent Medical Services you may have received from other than [facility] providers in the past year (date may be approximate).     Assessment:   This is a routine wellness examination for Jozalyn.  Hearing/Vision screen Hearing Screening - Comments:: Denies hearing difficulties  Vision Screening - Comments:: Does have vision concerns; will schedule eye exam soon   Dietary issues and exercise activities discussed: Current Exercise Habits: The patient does not participate in regular exercise at present   Goals Addressed   None    Depression Screen    06/10/2022    4:17 PM 06/08/2021    2:38 PM 06/01/2021    2:46 PM  PHQ 2/9 Scores  PHQ - 2 Score 0 1 1  PHQ- 9 Score  6 7    Fall Risk    06/10/2022    4:11 PM 06/08/2021    2:35 PM 06/01/2021    2:47 PM 12/31/2019   12:12 PM  [place] in the past year? 0 0 0 0  Number falls  in past yr: 0 0    Injury with Fall? 0 0    Risk for fall due to :  No Fall Risks    Follow up Falls prevention discussed;Education provided;Falls evaluation completed Falls prevention discussed      FALL RISK PREVENTION PERTAINING TO THE HOME:  Any stairs in or around the home? No  If so, are there any without handrails? No  Home free of loose throw rugs in walkways, pet beds, electrical cords, etc? Yes  Adequate lighting in your home to reduce risk of falls? Yes   ASSISTIVE DEVICES UTILIZED TO PREVENT FALLS:  Life alert? No  Use of a cane, walker or w/c? No  Grab bars in the bathroom? No  Shower chair or bench in shower? No  Elevated toilet seat or a handicapped toilet? Yes   TIMED UP AND GO:  Was the test performed? No . Telephonic visit   Cognitive Function:        06/10/2022    4:30 PM 06/08/2021    2:43 PM  6CIT Screen  What Year? 0 points 0 points  What month? 0 points 0 points  What time? 0 points 0 points  Count back from 20 0 points 0 points  Months in reverse 0 points 0 points  Repeat phrase 2 points 0 points  Total Score 2 points 0 points    Immunizations  There is no immunization history on file for this patient.  TDAP status: Due, Education has been provided regarding the importance of this vaccine. Advised may receive this vaccine at local pharmacy or Health Dept. Aware to provide a copy of the vaccination record if obtained from local pharmacy or Health Dept. Verbalized acceptance and understanding.  Flu Vaccine status: Declined, Education has been provided regarding the importance of this vaccine but patient still declined. Advised may receive this vaccine at local pharmacy or Health Dept. Aware to provide a copy of the vaccination record if obtained from local pharmacy or Health Dept. Verbalized acceptance and understanding.  Pneumococcal vaccine status: Declined,  Education has been provided regarding the importance of this vaccine but patient still  declined. Advised may receive this vaccine at local pharmacy or Health Dept. Aware to provide a copy of the vaccination record if obtained from local pharmacy or Health Dept. Verbalized acceptance and understanding.   Covid-19 vaccine status: Declined, Education has been provided regarding the importance of this vaccine but patient still declined. Advised may receive this vaccine at local pharmacy or Health Dept.or vaccine clinic. Aware to provide a copy of the vaccination record if obtained from local pharmacy or Health Dept. Verbalized acceptance and understanding.  Qualifies for Shingles Vaccine? Yes   Zostavax completed No   Shingrix Completed?: No.    Education has been provided regarding the importance of this vaccine. Patient has been advised to call insurance company to determine out of pocket expense if they have not yet received this vaccine. Advised may also receive vaccine at local pharmacy or Health Dept. Verbalized acceptance and understanding.  Screening Tests Health Maintenance  Topic Date Due   COVID-19 Vaccine (1) Never done   HIV Screening  Never done   Hepatitis C Screening  Never done   DTaP/Tdap/Td (1 - Tdap) Never done   Zoster Vaccines- Shingrix (1 of 2) Never done   PAP SMEAR-Modifier  Never done   COLONOSCOPY (Pts 45-4yr Insurance coverage will need to be confirmed)  Never done   Lung Cancer Screening  Never done   MAMMOGRAM  01/03/2020   INFLUENZA VACCINE  07/21/2022 (Originally 11/20/2021)   Medicare Annual Wellness (AWV)  06/11/2023   HPV VACCINES  Aged Out    Health Maintenance  Health Maintenance Due  Topic Date Due   COVID-19 Vaccine (1) Never done   HIV Screening  Never done   Hepatitis C Screening  Never done   DTaP/Tdap/Td (1 - Tdap) Never done   Zoster Vaccines- Shingrix (1 of 2) Never done   PAP SMEAR-Modifier  Never done   COLONOSCOPY (Pts 45-476yrInsurance coverage will need to be confirmed)  Never done   Lung Cancer Screening  Never done    MAMMOGRAM  01/03/2020    Colorectal cancer screening:  Patient declines at this time   Mammogram status: Patient declines at this time   Lung Cancer Screening: (Low Dose CT Chest recommended if Age 63-80ears, 30 pack-year currently smoking OR have quit w/in 15years.) does qualify.   Lung Cancer Screening Referral: Will discuss with provider at next office visit   Additional Screening:  Hepatitis C Screening: does qualify; Completed at next office visit   Vision Screening: Recommended annual ophthalmology exams for early detection of glaucoma and other disorders of the eye. Is the patient up to date with their annual eye exam?  No  Who is the provider or what is the name of the office in which the patient attends annual eye exams? None  If pt is not established with a provider, would they like to be referred to a provider to establish care? No .   Dental Screening: Recommended annual dental exams for proper oral hygiene  Community Resource Referral / Chronic Care Management: CRR required this visit?  No   CCM required this visit?  No      Plan:     I have personally reviewed and noted the following in the patient's chart:   Medical and social history Use of alcohol, tobacco or illicit drugs  Current medications and supplements including opioid prescriptions. Patient is not currently taking opioid prescriptions. Functional ability and status Nutritional status Physical activity Advanced directives List of other physicians Hospitalizations, surgeries, and ER visits in previous 12 months Vitals Screenings to include cognitive, depression, and falls Referrals and appointments  In addition, I have reviewed and discussed with patient certain preventive protocols, quality metrics, and best practice recommendations. A written personalized care plan for preventive services as well as general preventive health recommendations were provided to patient.     SlVanetta MuldersLP[place] [id number] Due to this being a virtual visit, the after visit summary with patients personalized plan was offered to patient via mail or my-chart.  per request, patient was mailed a copy of AVS.  Nurse Notes: Patient has concerns of increase joint pain and body aches.  States that she is unable to stand for long periods of time, with a history of back surgery for a herniated disk.  Will discuss further with provider at upcoming appointment.

## 2022-06-21 ENCOUNTER — Other Ambulatory Visit: Payer: Self-pay | Admitting: *Deleted

## 2022-06-21 MED ORDER — METHOCARBAMOL 500 MG PO TABS: ORAL_TABLET | ORAL | 0 refills | Status: DC

## 2022-06-21 MED ORDER — ALBUTEROL SULFATE HFA 108 (90 BASE) MCG/ACT IN AERS: INHALATION_SPRAY | RESPIRATORY_TRACT | 0 refills | Status: DC

## 2022-06-26 ENCOUNTER — Ambulatory Visit: Payer: Medicare Other | Admitting: Nurse Practitioner

## 2022-06-26 ENCOUNTER — Encounter: Payer: Self-pay | Admitting: Family Medicine

## 2022-06-26 ENCOUNTER — Ambulatory Visit (INDEPENDENT_AMBULATORY_CARE_PROVIDER_SITE_OTHER): Payer: Medicare Other | Admitting: Family Medicine

## 2022-06-26 VITALS — BP 118/78 | HR 67 | Temp 97.7°F | Ht 62.0 in | Wt 148.0 lb

## 2022-06-26 DIAGNOSIS — I252 Old myocardial infarction: Secondary | ICD-10-CM

## 2022-06-26 DIAGNOSIS — I480 Paroxysmal atrial fibrillation: Secondary | ICD-10-CM

## 2022-06-26 DIAGNOSIS — I2510 Atherosclerotic heart disease of native coronary artery without angina pectoris: Secondary | ICD-10-CM

## 2022-06-26 DIAGNOSIS — G8929 Other chronic pain: Secondary | ICD-10-CM

## 2022-06-26 DIAGNOSIS — K580 Irritable bowel syndrome with diarrhea: Secondary | ICD-10-CM

## 2022-06-26 DIAGNOSIS — I10 Essential (primary) hypertension: Secondary | ICD-10-CM | POA: Diagnosis not present

## 2022-06-26 DIAGNOSIS — E782 Mixed hyperlipidemia: Secondary | ICD-10-CM

## 2022-06-26 DIAGNOSIS — Z1211 Encounter for screening for malignant neoplasm of colon: Secondary | ICD-10-CM

## 2022-06-26 DIAGNOSIS — M5442 Lumbago with sciatica, left side: Secondary | ICD-10-CM

## 2022-06-26 DIAGNOSIS — K219 Gastro-esophageal reflux disease without esophagitis: Secondary | ICD-10-CM

## 2022-06-26 MED ORDER — GABAPENTIN 600 MG PO TABS: 300.0000 mg | ORAL_TABLET | Freq: Every day | ORAL | 1 refills | Status: DC

## 2022-06-26 NOTE — Progress Notes (Signed)
Established Patient Office Visit  Subjective   Patient ID: Angela Roth, female    DOB: 1960/10/28  Age: 62 y.o. MRN: [id number]  Chief Complaint  Patient presents with   Medical Management of Chronic Issues   Hypertension   nerve pain    ``    HPI  HTN Complaint with meds - Yes Current Medications - lisinopril, coreg Pertinent ROS:  Headache - No Fatigue - No Visual Disturbances - No Chest pain - No Dyspnea - No Palpitations - No LE edema - No  2. HLD On crestor.  3. GERD/IBS Diet controlled.  Hemoptysis - No Dysphagia or dyspepsia - No Water brash - No Red Flags (weight loss, hematochezia, melena, weight loss, early satiety, fevers, odynophagia, or persistent vomiting) - No  Taking bentyl. Reports this helps control cramping.   4. Chronic back pain Hx of lumbar surgery in 1990. Reports chronic sciatica of left side with as well as bilateral achy thigh pain. She was previously on gabapentin with good relief. Symptoms are worse at night. Denies saddle anesthesia, changes in bowel or bladder control, fever, or recent injury. Has tried tylenol and NSAIDs without improvement. She does take robaxin prn with some improvement for muscle spasms.  Reports that she has failed PT and injections in the past.     Past Medical History:  Diagnosis Date   Arthritis    CAD (coronary artery disease)    a. inferior STEMI 10/2015 s/p DESx3 to RCA.   Chronic headaches    Chronic sinusitis    COPD (chronic obstructive pulmonary disease) (HCC)    Depression    Essential hypertension    Fibromyalgia    GERD (gastroesophageal reflux disease)    History of ST elevation myocardial infarction (STEMI)    Hyperlipidemia    Hypothyroidism    Irritable bowel syndrome (IBS) AGE 77   DIARRHEA PREDOMINANT   LVF (left ventricular failure) ([place])    a. Transient LV failure during STEMI 10/2015 - EF 45-50% with f/u EF 55% by echo same adm.   Lymphocytic colitis 2010   Tobacco abuse        ROS As per HPI.   Objective:     BP 118/78   Pulse 67   Temp 97.7 F (36.5 C) (Temporal)   Ht '5\' 2"'$  (1.575 m)   Wt 148 lb (67.1 kg)   SpO2 96%   BMI 27.07 kg/m    Physical Exam Vitals and nursing note reviewed.  Constitutional:      General: She is not in acute distress.    Appearance: Normal appearance. She is not ill-appearing, toxic-appearing or diaphoretic.  Cardiovascular:     Rate and Rhythm: Normal rate and regular rhythm.     Heart sounds: Normal heart sounds. No murmur heard. Pulmonary:     Effort: Pulmonary effort is normal. No respiratory distress.     Breath sounds: Normal breath sounds.  Abdominal:     General: Bowel sounds are normal. There is no distension.     Palpations: Abdomen is soft.     Tenderness: There is no abdominal tenderness. There is no guarding or rebound.  Musculoskeletal:     Cervical back: Neck supple. No rigidity.     Lumbar back: No swelling, edema, deformity, signs of trauma, spasms, tenderness or bony tenderness. Normal range of motion. Positive right straight leg raise test and positive left straight leg raise test.     Right lower leg: No edema.     Left  lower leg: No edema.  Skin:    General: Skin is warm and dry.  Neurological:     General: No focal deficit present.     Mental Status: She is alert and oriented to person, place, and time.     Motor: No weakness.     Gait: Gait normal.  Psychiatric:        Mood and Affect: Mood normal.        Behavior: Behavior normal.      No results found for any visits on 06/26/22.    The 10-year ASCVD risk score (Arnett DK, et al., 2019) is: 9.7%    Assessment & Plan:   Angela Roth was seen today for medical management of chronic issues, hypertension and nerve pain.  Diagnoses and all orders for this visit:  Primary hypertension Well controlled on current regimen. Continue coreg and lisinopril.   Mixed hyperlipidemia Recent LDL at goal. Diet and exercise. Continue statin.    Paroxysmal atrial fibrillation (HCC) Regular with normal rate on exam today. Referral back to cardiology.  -     Ambulatory referral to Cardiology  Coronary artery disease involving native coronary artery of native heart, unspecified whether angina present History of ST elevation myocardial infarction (STEMI) On coreg and statin. Referral back to cardiology. -     Ambulatory referral to Cardiology  Irritable bowel syndrome with diarrhea Well controlled on current regimen. Continue bentyl.   Gastroesophageal reflux disease without esophagitis Diet controlled.   Chronic bilateral low back pain with left-sided sciatica No red flags. Will restart gabapentin at bedtime. Tylenol, stretching. Can take robaxin prn for spasms.  -     gabapentin (NEURONTIN) 600 MG tablet; Take 0.5 tablets (300 mg total) by mouth at bedtime.  Colon cancer screening -     Ambulatory referral to Gastroenterology   Return in about 3 months (around 09/26/2022) for chronic follow up. Sooner for new or worsening symptoms.   The patient indicates understanding of these issues and agrees with the plan.   Gwenlyn Perking, FNP

## 2022-06-27 ENCOUNTER — Encounter: Payer: Self-pay | Admitting: *Deleted

## 2022-07-19 ENCOUNTER — Other Ambulatory Visit (HOSPITAL_COMMUNITY): Payer: Self-pay

## 2022-08-05 ENCOUNTER — Telehealth: Payer: Self-pay

## 2022-08-05 NOTE — Telephone Encounter (Signed)
Bufford Buttner ([employer]Dietrich Pates) PA Case ID #: [id number] Rx #: [id number] Need Help? Call us at [phone] Status sent iconSent to Plan today Drug Albuterol Sulfate HFA 108 (90 Base)MCG/ACT aerosol ePA cloud logo Form [facility] Medicare Part D Electronic Prior Authorization Form (2017 NCPDP) Original Claim Info [id number] Provide Exception Process Printed NoticeNon-Form, Dr. Augustine Radar ePA at [email or web address] HFA (PROAIR);VENTOLIN HFA;LEVALBUTEROL HFA PREF'D For RxLocal Coupon Price of: $49.17 submit to BIN: [id number] PCN: CP Group: [facility] --Service provided at no cost and no switch fee to the pharmacy-

## 2022-08-06 NOTE — Telephone Encounter (Signed)
Contacted pharmacy, they have re-ran the prescription as Ventolin brand name and it is on the way to the patient.  Bufford Buttner ([employer]ietrich Pates) PA Case ID #: [id number] Rx #: [id number] Need Help? Call us at [phone] Outcome N/A on April 15 This medication or product is on your plan's list of covered drugs. Prior authorization is not required at this time. If your pharmacy has questions regarding the processing of your prescription, please have them call the [facility] pharmacy help desk at [phone]. **Please note: This request was submitted electronically. Formulary lowering, tiering exception, cost reduction and/or pre-benefit determination review (including prospective Medicare hospice reviews) requests cannot be requested using this method of submission. Providers contact us at [phone] for further assistance. Drug Albuterol Sulfate HFA 108 (90 Base)MCG/ACT aerosol ePA cloud logo Form [facility] Medicare Part D Electronic Prior Authorization Form (2017 NCPDP) Original Claim Info [id number] Provide Exception Process Printed NoticeNon-Form, Dr. Augustine Radar ePA at [email or web address] HFA (PROAIR);VENTOLIN HFA;LEVALBUTEROL HFA PREF'D For RxLocal Coupon Price of: $49.17 submit to BIN: [id number] PCN: CP Group: [facility] --Service provided at no cost and no switch fee to the pharmacy--

## 2022-08-21 ENCOUNTER — Ambulatory Visit: Payer: Non-veteran care | Admitting: Internal Medicine

## 2022-09-23 ENCOUNTER — Other Ambulatory Visit: Payer: Self-pay | Admitting: Family Medicine

## 2022-10-21 ENCOUNTER — Ambulatory Visit: Payer: Non-veteran care | Admitting: Internal Medicine

## 2022-10-23 ENCOUNTER — Other Ambulatory Visit: Payer: Self-pay | Admitting: *Deleted

## 2022-10-23 MED ORDER — LISINOPRIL 10 MG PO TABS: ORAL_TABLET | ORAL | 0 refills | Status: DC

## 2022-11-06 ENCOUNTER — Other Ambulatory Visit: Payer: Self-pay | Admitting: *Deleted

## 2022-11-06 MED ORDER — FLUTICASONE PROPIONATE 50 MCG/ACT NA SUSP: 1.0000 | Freq: Every day | NASAL | 1 refills | Status: AC

## 2022-12-04 ENCOUNTER — Other Ambulatory Visit: Payer: Self-pay | Admitting: *Deleted

## 2022-12-04 MED ORDER — DICYCLOMINE HCL 10 MG PO CAPS: ORAL_CAPSULE | ORAL | 3 refills | Status: DC

## 2022-12-22 ENCOUNTER — Other Ambulatory Visit: Payer: Self-pay | Admitting: Family Medicine

## 2022-12-25 ENCOUNTER — Other Ambulatory Visit: Payer: Self-pay | Admitting: Family Medicine

## 2022-12-25 ENCOUNTER — Ambulatory Visit: Payer: Non-veteran care | Attending: Internal Medicine | Admitting: Internal Medicine

## 2022-12-25 NOTE — Progress Notes (Signed)
Erroneous encounter - please disregard.

## 2022-12-30 ENCOUNTER — Encounter: Payer: Self-pay | Admitting: *Deleted

## 2023-01-23 ENCOUNTER — Other Ambulatory Visit: Payer: Self-pay | Admitting: *Deleted

## 2023-01-23 MED ORDER — DICYCLOMINE HCL 10 MG PO CAPS: ORAL_CAPSULE | ORAL | 0 refills | Status: DC

## 2023-02-07 ENCOUNTER — Encounter: Payer: Self-pay | Admitting: Family Medicine

## 2023-02-07 ENCOUNTER — Ambulatory Visit (INDEPENDENT_AMBULATORY_CARE_PROVIDER_SITE_OTHER): Payer: Medicare Other | Admitting: Family Medicine

## 2023-02-07 VITALS — BP 142/84 | HR 63 | Temp 97.8°F | Ht 62.0 in | Wt 148.1 lb

## 2023-02-07 DIAGNOSIS — I480 Paroxysmal atrial fibrillation: Secondary | ICD-10-CM

## 2023-02-07 DIAGNOSIS — B372 Candidiasis of skin and nail: Secondary | ICD-10-CM

## 2023-02-07 DIAGNOSIS — M5442 Lumbago with sciatica, left side: Secondary | ICD-10-CM

## 2023-02-07 DIAGNOSIS — E782 Mixed hyperlipidemia: Secondary | ICD-10-CM

## 2023-02-07 DIAGNOSIS — I10 Essential (primary) hypertension: Secondary | ICD-10-CM | POA: Diagnosis not present

## 2023-02-07 DIAGNOSIS — K580 Irritable bowel syndrome with diarrhea: Secondary | ICD-10-CM

## 2023-02-07 DIAGNOSIS — R399 Unspecified symptoms and signs involving the genitourinary system: Secondary | ICD-10-CM

## 2023-02-07 DIAGNOSIS — I2510 Atherosclerotic heart disease of native coronary artery without angina pectoris: Secondary | ICD-10-CM | POA: Diagnosis not present

## 2023-02-07 DIAGNOSIS — K219 Gastro-esophageal reflux disease without esophagitis: Secondary | ICD-10-CM

## 2023-02-07 DIAGNOSIS — G8929 Other chronic pain: Secondary | ICD-10-CM

## 2023-02-07 DIAGNOSIS — J4520 Mild intermittent asthma, uncomplicated: Secondary | ICD-10-CM

## 2023-02-07 LAB — URINALYSIS, ROUTINE W REFLEX MICROSCOPIC
Bilirubin, UA: NEGATIVE
Glucose, UA: NEGATIVE
Ketones, UA: NEGATIVE
Nitrite, UA: NEGATIVE
Protein,UA: NEGATIVE
RBC, UA: NEGATIVE
Specific Gravity, UA: 1.02 (ref 1.005–1.030)
Urobilinogen, Ur: 0.2 mg/dL (ref 0.2–1.0)
pH, UA: 6 (ref 5.0–7.5)

## 2023-02-07 LAB — MICROSCOPIC EXAMINATION
RBC, Urine: NONE SEEN /[HPF] (ref 0–2)
Renal Epithel, UA: NONE SEEN /[HPF]
Yeast, UA: NONE SEEN

## 2023-02-07 MED ORDER — CARVEDILOL 25 MG PO TABS
25.0000 mg | ORAL_TABLET | Freq: Every day | ORAL | 3 refills | Status: DC
Start: 2023-02-07 — End: 2024-01-05

## 2023-02-07 MED ORDER — METHOCARBAMOL 500 MG PO TABS
ORAL_TABLET | ORAL | 0 refills | Status: DC
Start: 2023-02-07 — End: 2023-06-20

## 2023-02-07 MED ORDER — FLUCONAZOLE 150 MG PO TABS
150.0000 mg | ORAL_TABLET | Freq: Once | ORAL | 0 refills | Status: AC
Start: 2023-02-07 — End: 2023-02-07

## 2023-02-07 MED ORDER — GABAPENTIN 600 MG PO TABS
300.0000 mg | ORAL_TABLET | Freq: Every day | ORAL | 3 refills | Status: DC
Start: 2023-02-07 — End: 2024-01-05

## 2023-02-07 MED ORDER — DICYCLOMINE HCL 10 MG PO CAPS
ORAL_CAPSULE | ORAL | 3 refills | Status: DC
Start: 2023-02-07 — End: 2024-01-05

## 2023-02-07 MED ORDER — ALBUTEROL SULFATE HFA 108 (90 BASE) MCG/ACT IN AERS
INHALATION_SPRAY | RESPIRATORY_TRACT | 1 refills | Status: DC
Start: 2023-02-07 — End: 2023-03-06

## 2023-02-07 MED ORDER — LISINOPRIL 10 MG PO TABS
ORAL_TABLET | ORAL | 3 refills | Status: DC
Start: 2023-02-07 — End: 2023-12-16

## 2023-02-07 MED ORDER — CEPHALEXIN 500 MG PO CAPS
500.0000 mg | ORAL_CAPSULE | Freq: Two times a day (BID) | ORAL | 0 refills | Status: DC
Start: 2023-02-07 — End: 2024-01-05

## 2023-02-07 NOTE — Progress Notes (Signed)
Established Patient Office Visit  Subjective   Patient ID: Angela Roth, female    DOB: 1960/11/20  Age: 62 y.o. MRN: [id number]  Chief Complaint  Patient presents with   Medical Management of Chronic Issues   Hypertension    HPI  HTN Complaint with meds - Yes Current Medications - lisinopril, coreg BP at home: 120s/80s Pertinent ROS:  Headache - No Fatigue - No Visual Disturbances - No Chest pain - No Dyspnea - No Palpitations - No LE edema - No  2. HLD Stopped crestor due to myalgias.   3. GERD/IBS Diet controlled.  Hemoptysis - No Dysphagia or dyspepsia - No Water brash - No Red Flags (weight loss, hematochezia, melena, weight loss, early satiety, fevers, odynophagia, or persistent vomiting) - No  Taking bentyl. Reports this helps control cramping.   4. Rash Rash in groin area. Itchy and burns. Had same rash before that cleared up with diflucan.   5. Urinary frequency Reports lower abdominal pressure, frequency, urgency for the last few weeks. Unchanged. No flank pain, fever, chills, nausea, vomiting, vaginal itching or discharge. Hx of UTIs.    6. Chronic back pain Stable. Taking gabapentin and using robaxin prn.    Past Medical History:  Diagnosis Date   Arthritis    CAD (coronary artery disease)    a. inferior STEMI 10/2015 s/p DESx3 to RCA.   Chronic headaches    Chronic sinusitis    COPD (chronic obstructive pulmonary disease) (HCC)    Depression    Essential hypertension    Fibromyalgia    GERD (gastroesophageal reflux disease)    History of ST elevation myocardial infarction (STEMI)    Hyperlipidemia    Hypothyroidism    Irritable bowel syndrome (IBS) AGE 83   DIARRHEA PREDOMINANT   LVF (left ventricular failure) (HCC)    a. Transient LV failure during STEMI 10/2015 - EF 45-50% with f/u EF 55% by echo same adm.   Lymphocytic colitis 2010   Tobacco abuse       ROS As per HPI.   Objective:     BP (!) 142/84   Pulse 63   Temp  97.8 F (36.6 C) (Temporal)   Ht 5\' 2"  (1.575 m)   Wt 148 lb 2 oz (67.2 kg)   SpO2 97%   BMI 27.09 kg/m   BP Readings from Last 3 Encounters:  02/07/23 (!) 142/84  06/26/22 118/78  12/21/21 107/74     Physical Exam Vitals and nursing note reviewed.  Constitutional:      General: She is not in acute distress.    Appearance: Normal appearance. She is not ill-appearing, toxic-appearing or diaphoretic.  Cardiovascular:     Rate and Rhythm: Normal rate and regular rhythm.     Heart sounds: Normal heart sounds. No murmur heard. Pulmonary:     Effort: Pulmonary effort is normal. No respiratory distress.     Breath sounds: Normal breath sounds.  Abdominal:     General: Bowel sounds are normal. There is no distension.     Palpations: Abdomen is soft.     Tenderness: There is no abdominal tenderness. There is no right CVA tenderness, left CVA tenderness, guarding or rebound.  Musculoskeletal:     Cervical back: Neck supple. No rigidity.     Lumbar back: No swelling, edema, deformity, signs of trauma, spasms, tenderness or bony tenderness. Normal range of motion. Positive right straight leg raise test and positive left straight leg raise test.  Right lower leg: No edema.     Left lower leg: No edema.  Skin:    General: Skin is warm and dry.  Neurological:     General: No focal deficit present.     Mental Status: She is alert and oriented to person, place, and time.     Motor: No weakness.     Gait: Gait normal.  Psychiatric:        Mood and Affect: Mood normal.        Behavior: Behavior normal.      No results found for any visits on 02/07/23.    The ASCVD Risk score (Arnett DK, et al., 2019) failed to calculate for the following reasons:   The patient has a prior MI or stroke diagnosis    Assessment & Plan:   Thora was seen today for medical management of chronic issues and hypertension.  Diagnoses and all orders for this visit:  Primary hypertension BP well  controlled at home. Continue current regimen.  -     CBC with Differential/Platelet; Future -     CMP14+EGFR; Future -     TSH; Future -     carvedilol (COREG) 25 MG tablet; Take 1 tablet (25 mg total) by mouth daily. -     lisinopril (ZESTRIL) 10 MG tablet; TAKE ONE TABLET BY MOUTH DAILY  Mixed hyperlipidemia Myalgias with crestor. Declined trying another statin.  -     Lipid panel; Future  Paroxysmal atrial fibrillation (HCC) Regular rhythm today.   Coronary artery disease involving native coronary artery of native heart, unspecified whether angina present -     carvedilol (COREG) 25 MG tablet; Take 1 tablet (25 mg total) by mouth daily.  Irritable bowel syndrome with diarrhea Well controlled on current regimen.  -     dicyclomine (BENTYL) 10 MG capsule; 1 PO 30 MINUTES PRIOR TO FIRST MEAL  Gastroesophageal reflux disease without esophagitis Well controlled on current regimen.   Yeast dermatitis -     fluconazole (DIFLUCAN) 150 MG tablet; Take 1 tablet (150 mg total) by mouth once for 1 dose. Take one tablet by mouth and repeat in 3 days  UTI symptoms UA with 1+ leuks with few bacteria. Will treat empirically with keflex based on symptoms pending culture.  -     Urinalysis, Routine w reflex microscopic -     Urine Culture -     cephALEXin (KEFLEX) 500 MG capsule; Take 1 capsule (500 mg total) by mouth 2 (two) times daily. -     Microscopic Examination  Chronic bilateral low back pain with left-sided sciatica Refill provided.  -     gabapentin (NEURONTIN) 600 MG tablet; Take 0.5 tablets (300 mg total) by mouth at bedtime. -     methocarbamol (ROBAXIN) 500 MG tablet; TAKE ONE TABLET BY MOUTH DAILY AT BEDTIME  Mild intermittent asthma without complication Well controlled on current regimen.  -     albuterol (VENTOLIN HFA) 108 (90 Base) MCG/ACT inhaler; INHALE TWO PUFFS BY MOUTH EVERY 4 TO 6 HOURS AS NEEDED FOR WHEEZING  Return in about 3 months (around 05/10/2023) for chronic  follow up. Sooner for new or worsening symptoms.   The patient indicates understanding of these issues and agrees with the plan.   Gabriel Earing, FNP

## 2023-02-10 LAB — URINE CULTURE

## 2023-02-12 ENCOUNTER — Telehealth: Payer: Self-pay | Admitting: Family Medicine

## 2023-02-12 ENCOUNTER — Other Ambulatory Visit: Payer: Self-pay | Admitting: Family Medicine

## 2023-02-12 DIAGNOSIS — J4520 Mild intermittent asthma, uncomplicated: Secondary | ICD-10-CM

## 2023-02-12 NOTE — Telephone Encounter (Signed)
Contacted pharmacy and clarified

## 2023-02-12 NOTE — Telephone Encounter (Signed)
It seems that Tiffany sent one in on 02/07/23. She instructed 2 inhalations every 4-6 hours as needed.

## 2023-02-12 NOTE — Telephone Encounter (Signed)
[facility] called requesting that PCP clarify pts Albuterol inhaler Rx and resend. Needs to know if pt can use any kind of Albuterol inhaler or specific one? Also needs to know how many inhalers, and dosage.

## 2023-02-20 ENCOUNTER — Other Ambulatory Visit: Payer: Medicare Other

## 2023-02-20 DIAGNOSIS — I10 Essential (primary) hypertension: Secondary | ICD-10-CM

## 2023-02-20 DIAGNOSIS — E782 Mixed hyperlipidemia: Secondary | ICD-10-CM

## 2023-02-21 ENCOUNTER — Other Ambulatory Visit: Payer: Medicare Other

## 2023-02-21 ENCOUNTER — Other Ambulatory Visit: Payer: Self-pay

## 2023-02-21 DIAGNOSIS — E870 Hyperosmolality and hypernatremia: Secondary | ICD-10-CM

## 2023-02-21 LAB — CBC WITH DIFFERENTIAL/PLATELET
Basophils Absolute: 0.1 10*3/uL (ref 0.0–0.2)
Basos: 1 %
EOS (ABSOLUTE): 0.4 10*3/uL (ref 0.0–0.4)
Eos: 5 %
Hematocrit: 43.4 % (ref 34.0–46.6)
Hemoglobin: 14.1 g/dL (ref 11.1–15.9)
Immature Grans (Abs): 0.1 10*3/uL (ref 0.0–0.1)
Immature Granulocytes: 1 %
Lymphocytes Absolute: 2.6 10*3/uL (ref 0.7–3.1)
Lymphs: 28 %
MCH: 31 pg (ref 26.6–33.0)
MCHC: 32.5 g/dL (ref 31.5–35.7)
MCV: 95 fL (ref 79–97)
Monocytes Absolute: 0.7 10*3/uL (ref 0.1–0.9)
Monocytes: 8 %
Neutrophils Absolute: 5.2 10*3/uL (ref 1.4–7.0)
Neutrophils: 57 %
Platelets: 339 10*3/uL (ref 150–450)
RBC: 4.55 x10E6/uL (ref 3.77–5.28)
RDW: 12.7 % (ref 11.7–15.4)
WBC: 9.1 10*3/uL (ref 3.4–10.8)

## 2023-02-21 LAB — LIPID PANEL
Chol/HDL Ratio: 6.2 ratio — ABNORMAL HIGH (ref 0.0–4.4)
Cholesterol, Total: 240 mg/dL — ABNORMAL HIGH (ref 100–199)
HDL: 39 mg/dL — ABNORMAL LOW (ref >39)
LDL Chol Calc (NIH): 168 mg/dL — ABNORMAL HIGH (ref 0–99)
Triglycerides: 179 mg/dL — ABNORMAL HIGH (ref 0–149)
VLDL Cholesterol Cal: 33 mg/dL (ref 5–40)

## 2023-02-21 LAB — CMP14+EGFR
ALT: 10 [IU]/L (ref 0–32)
AST: 9 [IU]/L (ref 0–40)
Albumin: 4.6 g/dL (ref 3.9–4.9)
Alkaline Phosphatase: 108 [IU]/L (ref 44–121)
BUN/Creatinine Ratio: 12 (ref 12–28)
BUN: 9 mg/dL (ref 8–27)
Bilirubin Total: 0.3 mg/dL (ref 0.0–1.2)
CO2: 23 mmol/L (ref 20–29)
Calcium: 10.1 mg/dL (ref 8.7–10.3)
Chloride: 94 mmol/L — ABNORMAL LOW (ref 96–106)
Creatinine, Ser: 0.76 mg/dL (ref 0.57–1.00)
Globulin, Total: 2.5 g/dL (ref 1.5–4.5)
Glucose: 96 mg/dL (ref 70–99)
Potassium: 5.1 mmol/L (ref 3.5–5.2)
Sodium: 132 mmol/L — ABNORMAL LOW (ref 134–144)
Total Protein: 7.1 g/dL (ref 6.0–8.5)
eGFR: 88 mL/min/{1.73_m2} (ref >59)

## 2023-02-21 LAB — TSH: TSH: 2.14 u[IU]/mL (ref 0.450–4.500)

## 2023-02-28 ENCOUNTER — Telehealth: Payer: Self-pay | Admitting: Family Medicine

## 2023-02-28 NOTE — Telephone Encounter (Signed)
Pt advised she needs to repeat labs and scheduled her 11/11 on lab schedule.

## 2023-03-03 ENCOUNTER — Other Ambulatory Visit: Payer: Medicare Other

## 2023-03-03 DIAGNOSIS — E870 Hyperosmolality and hypernatremia: Secondary | ICD-10-CM

## 2023-03-04 LAB — BMP8+EGFR
BUN/Creatinine Ratio: 10 — ABNORMAL LOW (ref 12–28)
BUN: 7 mg/dL — ABNORMAL LOW (ref 8–27)
CO2: 21 mmol/L (ref 20–29)
Calcium: 9.8 mg/dL (ref 8.7–10.3)
Chloride: 93 mmol/L — ABNORMAL LOW (ref 96–106)
Creatinine, Ser: 0.73 mg/dL (ref 0.57–1.00)
Glucose: 96 mg/dL (ref 70–99)
Potassium: 4.8 mmol/L (ref 3.5–5.2)
Sodium: 132 mmol/L — ABNORMAL LOW (ref 134–144)
eGFR: 92 mL/min/{1.73_m2} (ref >59)

## 2023-03-06 ENCOUNTER — Telehealth: Payer: Self-pay | Admitting: Family Medicine

## 2023-03-06 DIAGNOSIS — J4520 Mild intermittent asthma, uncomplicated: Secondary | ICD-10-CM

## 2023-03-06 MED ORDER — ALBUTEROL SULFATE HFA 108 (90 BASE) MCG/ACT IN AERS: INHALATION_SPRAY | RESPIRATORY_TRACT | 3 refills | Status: AC

## 2023-03-06 NOTE — Telephone Encounter (Signed)
90 day supply sent to pharmacy

## 2023-03-06 NOTE — Telephone Encounter (Signed)
Copied from CRM [id number]. Topic: Clinical - Medication Refill >> Mar 06, 2023 11:02 AM Theodis Sato wrote: Reason for CRM:  Pharmacy call from [facility] at [employer] requesting a 90 day refill on Albuterol 90 mg inhaler.

## 2023-06-20 ENCOUNTER — Other Ambulatory Visit: Payer: Self-pay | Admitting: Family Medicine

## 2023-06-20 DIAGNOSIS — G8929 Other chronic pain: Secondary | ICD-10-CM

## 2023-09-24 ENCOUNTER — Ambulatory Visit: Payer: Medicare Other

## 2023-09-24 VITALS — BP 142/84 | HR 63 | Ht 62.0 in | Wt 148.0 lb

## 2023-09-24 DIAGNOSIS — Z0000 Encounter for general adult medical examination without abnormal findings: Secondary | ICD-10-CM

## 2023-09-24 DIAGNOSIS — Z01419 Encounter for gynecological examination (general) (routine) without abnormal findings: Secondary | ICD-10-CM | POA: Insufficient documentation

## 2023-09-24 DIAGNOSIS — Z90710 Acquired absence of both cervix and uterus: Secondary | ICD-10-CM | POA: Insufficient documentation

## 2023-09-24 DIAGNOSIS — F341 Dysthymic disorder: Secondary | ICD-10-CM | POA: Insufficient documentation

## 2023-09-24 DIAGNOSIS — Z1231 Encounter for screening mammogram for malignant neoplasm of breast: Secondary | ICD-10-CM

## 2023-09-24 DIAGNOSIS — G5760 Lesion of plantar nerve, unspecified lower limb: Secondary | ICD-10-CM | POA: Insufficient documentation

## 2023-09-24 DIAGNOSIS — F4312 Post-traumatic stress disorder, chronic: Secondary | ICD-10-CM | POA: Insufficient documentation

## 2023-09-24 DIAGNOSIS — F17200 Nicotine dependence, unspecified, uncomplicated: Secondary | ICD-10-CM | POA: Insufficient documentation

## 2023-09-24 DIAGNOSIS — Z83719 Family history of colon polyps, unspecified: Secondary | ICD-10-CM | POA: Insufficient documentation

## 2023-09-24 DIAGNOSIS — Z9049 Acquired absence of other specified parts of digestive tract: Secondary | ICD-10-CM | POA: Insufficient documentation
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
## 2023-09-24 NOTE — Patient Instructions (Signed)
 Ms. Rylee , Thank you for taking time out of your busy schedule to complete your Annual Wellness Visit with me. I enjoyed our conversation and look forward to speaking with you again next year. I, as well as your care team,  appreciate your ongoing commitment to your health goals. Please review the following plan we discussed and let me know if I can assist you in the future. Your Game plan/ To Do List   Follow up Visits: Next Medicare AWV with our clinical staff: 09/24/24 at 11:20a.m.   Next Office Visit with your provider: n/a  Clinician Recommendations:  Aim for 30 minutes of exercise or brisk walking, 6-8 glasses of water, and 5 servings of fruits and vegetables each day. Please discuss the following with your provider at your next office visit so we can update your chart: due for-Mammogram, colonoscopy, Pneumonia vaccine, HIV/Hepatitis C Screening.      This is a list of the screening recommended for you and due dates:  Health Maintenance  Topic Date Due   HIV Screening  Never done   Hepatitis C Screening  Never done   Pneumococcal Vaccination (1 of 2 - PCV) Never done   Colon Cancer Screening  Never done   Screening for Lung Cancer  Never done   Mammogram  01/03/2020   Zoster (Shingles) Vaccine (1 of 2) 09/26/2023*   DTaP/Tdap/Td vaccine (1 - Tdap) 02/07/2024*   COVID-19 Vaccine (1) 10/09/2024*   Flu Shot  11/21/2023   Medicare Annual Wellness Visit  09/23/2024   HPV Vaccine  Aged Out   Meningitis B Vaccine  Aged Out  *Topic was postponed. The date shown is not the original due date.    Advanced directives: (Declined) Advance directive discussed with you today. Even though you declined this today, please call our office should you change your mind, and we can give you the proper paperwork for you to fill out. Advance Care Planning is important because it:  [x]  Makes sure you receive the medical care that is consistent with your values, goals, and preferences  [x]  It provides  guidance to your family and loved ones and reduces their decisional burden about whether or not they are making the right decisions based on your wishes.  Follow the link provided in your after visit summary or read over the paperwork we have mailed to you to help you started getting your Advance Directives in place. If you need assistance in completing these, please reach out to us  so that we can help you!  See attachments for Preventive Care and Fall Prevention Tips.

## 2023-12-16 ENCOUNTER — Other Ambulatory Visit: Payer: Self-pay | Admitting: Family Medicine

## 2023-12-16 DIAGNOSIS — G8929 Other chronic pain: Secondary | ICD-10-CM

## 2023-12-16 DIAGNOSIS — I10 Essential (primary) hypertension: Secondary | ICD-10-CM

## 2023-12-16 MED ORDER — LISINOPRIL 10 MG PO TABS
ORAL_TABLET | ORAL | 0 refills | Status: DC
Start: 2023-12-16 — End: 2024-01-05

## 2023-12-16 NOTE — Addendum Note (Signed)
 Addended by: Edyn Qazi D on: 12/16/2023 11:26 AM   Modules accepted: Orders

## 2023-12-16 NOTE — Telephone Encounter (Signed)
 I called pt & I made her an appt w/TM at 2:45pm for Med refill.

## 2023-12-16 NOTE — Telephone Encounter (Signed)
 Tiffany NTBS was to be seen for 3 mos FU from 10/24 NO RF sent to pharmacy

## 2023-12-17 MED ORDER — METHOCARBAMOL 500 MG PO TABS: 500.0000 mg | ORAL_TABLET | Freq: Every day | ORAL | 0 refills | Status: DC

## 2024-01-05 ENCOUNTER — Ambulatory Visit (INDEPENDENT_AMBULATORY_CARE_PROVIDER_SITE_OTHER): Admitting: Family Medicine

## 2024-01-05 ENCOUNTER — Encounter: Payer: Self-pay | Admitting: Family Medicine

## 2024-01-05 VITALS — BP 110/77 | HR 61 | Temp 97.6°F | Ht 62.0 in | Wt 150.4 lb

## 2024-01-05 DIAGNOSIS — I252 Old myocardial infarction: Secondary | ICD-10-CM | POA: Diagnosis not present

## 2024-01-05 DIAGNOSIS — G8929 Other chronic pain: Secondary | ICD-10-CM | POA: Diagnosis not present

## 2024-01-05 DIAGNOSIS — E782 Mixed hyperlipidemia: Secondary | ICD-10-CM | POA: Diagnosis not present

## 2024-01-05 DIAGNOSIS — K219 Gastro-esophageal reflux disease without esophagitis: Secondary | ICD-10-CM | POA: Diagnosis not present

## 2024-01-05 DIAGNOSIS — J449 Chronic obstructive pulmonary disease, unspecified: Secondary | ICD-10-CM | POA: Insufficient documentation

## 2024-01-05 DIAGNOSIS — K580 Irritable bowel syndrome with diarrhea: Secondary | ICD-10-CM

## 2024-01-05 DIAGNOSIS — R5383 Other fatigue: Secondary | ICD-10-CM | POA: Diagnosis not present

## 2024-01-05 DIAGNOSIS — I2510 Atherosclerotic heart disease of native coronary artery without angina pectoris: Secondary | ICD-10-CM

## 2024-01-05 DIAGNOSIS — M5442 Lumbago with sciatica, left side: Secondary | ICD-10-CM

## 2024-01-05 DIAGNOSIS — I10 Essential (primary) hypertension: Secondary | ICD-10-CM | POA: Diagnosis not present

## 2024-01-05 DIAGNOSIS — I480 Paroxysmal atrial fibrillation: Secondary | ICD-10-CM

## 2024-01-05 MED ORDER — DICYCLOMINE HCL 10 MG PO CAPS: ORAL_CAPSULE | ORAL | 3 refills | Status: AC

## 2024-01-05 MED ORDER — LISINOPRIL 10 MG PO TABS: ORAL_TABLET | ORAL | 3 refills | Status: AC

## 2024-01-05 MED ORDER — CARVEDILOL 25 MG PO TABS: 25.0000 mg | ORAL_TABLET | Freq: Every day | ORAL | 3 refills | Status: AC

## 2024-01-05 MED ORDER — GABAPENTIN 600 MG PO TABS: 300.0000 mg | ORAL_TABLET | Freq: Every day | ORAL | 3 refills | Status: AC

## 2024-01-05 MED ORDER — BREZTRI AEROSPHERE 160-9-4.8 MCG/ACT IN AERO: 2.0000 | INHALATION_SPRAY | Freq: Two times a day (BID) | RESPIRATORY_TRACT | 11 refills | Status: AC

## 2024-01-05 NOTE — Progress Notes (Signed)
 Established Patient Office Visit  Subjective   Patient ID: Angela Roth, female    DOB: May 22, 1960  Age: 63 y.o. MRN: [id number]  Chief Complaint  Patient presents with   Medical Management of Chronic Issues    HPI  History of Present Illness   Angela Roth is a 63 year old female with COPD and coronary artery disease who presents for a follow-up visit.  Dyspnea and wheezing - Episodes of dyspnea and wheezing, especially at night - Requires use of albuterol  inhaler for symptom relief - Persistent fatigue impacting daily activities, necessitating frequent naps  Tobacco use - Continues to smoke - Unable to use smoking cessation aids due to severe side effects  Coronary artery disease and cardiac history - History of myocardial infarction with placement of three stents - No cardiology follow-up for years due to previous traumatic experience with cardioversion for A. fib - No chest pain, edema, palpitations - Takes daily aspirin   Back pain and neuropathy - Gabapentin  provides relief for back pain - Numbness and tingling in leg and foot, sometimes associated with cold sensation  Gastroesophageal reflux disease (gerd) - GERD well-controlled with medication  Irritable bowel syndrome (ibs) - Manages IBS by avoiding certain foods  - Compliant with bentyl   Hyperlipidemia intolerance - Does not take medication for hyperlipidemia due to concerns about muscle and joint pain          01/05/2024    3:17 PM 09/24/2023   10:32 AM 02/07/2023    1:49 PM  Depression screen PHQ 2/9  Decreased Interest 1 0 1  Down, Depressed, Hopeless 0 0 1  PHQ - 2 Score 1 0 2  Altered sleeping 1  2  Tired, decreased energy 3  3  Change in appetite 2  2  Feeling bad or failure about yourself  0  0  Trouble concentrating 1  0  Moving slowly or fidgety/restless 0  0  Suicidal thoughts 0  0  PHQ-9 Score 8  9  Difficult doing work/chores Somewhat difficult  Somewhat difficult      01/05/2024     3:18 PM 02/07/2023    1:47 PM 06/26/2022    1:31 PM 06/01/2021    2:46 PM  GAD 7 : Generalized Anxiety Score  Nervous, Anxious, on Edge 1 1 1 1   Control/stop worrying 1 1 1 1   Worry too much - different things 0 1 1 1   Trouble relaxing 1 1 1 1   Restless 0 0 0 0  Easily annoyed or irritable 1 1 1 1   Afraid - awful might happen 1 0 1 1  Total GAD 7 Score 5 5 6 6   Anxiety Difficulty Somewhat difficult Not difficult at all Somewhat difficult Somewhat difficult        ROS Negative unless specially indicated above in HPI.   Objective:     BP 110/77   Pulse 61   Temp 97.6 F (36.4 C) (Temporal)   Ht 5' 2 (1.575 m)   Wt 150 lb 6.4 oz (68.2 kg)   SpO2 99%   BMI 27.51 kg/m    Physical Exam Vitals and nursing note reviewed.  Constitutional:      General: She is not in acute distress.    Appearance: Normal appearance. She is not ill-appearing.  Neck:     Thyroid : No thyroid  mass, thyromegaly or thyroid  tenderness.  Cardiovascular:     Rate and Rhythm: Normal rate and regular rhythm.     Pulses: Normal pulses.  Heart sounds: Normal heart sounds. No murmur heard. Pulmonary:     Effort: Pulmonary effort is normal. No respiratory distress.     Breath sounds: Normal breath sounds.  Abdominal:     General: Bowel sounds are normal. There is no distension.     Palpations: Abdomen is soft. There is no mass.     Tenderness: There is no abdominal tenderness. There is no guarding or rebound.  Musculoskeletal:     Right lower leg: No edema.     Left lower leg: No edema.  Skin:    General: Skin is warm and dry.  Neurological:     General: No focal deficit present.     Mental Status: She is alert and oriented to person, place, and time.  Psychiatric:        Mood and Affect: Mood normal.        Behavior: Behavior normal.      No results found for any visits on 01/05/24.    The ASCVD Risk score (Arnett DK, et al., 2019) failed to calculate for the following  reasons:   Risk score cannot be calculated because patient has a medical history suggesting prior/existing ASCVD    Assessment & Plan:   Tiasia was seen today for medical management of chronic issues.  Diagnoses and all orders for this visit:  Primary hypertension -     Ambulatory referral to Cardiology -     carvedilol  (COREG ) 25 MG tablet; Take 1 tablet (25 mg total) by mouth daily. -     lisinopril  (ZESTRIL ) 10 MG tablet; TAKE ONE TABLET BY MOUTH DAILY  Mixed hyperlipidemia  Chronic obstructive pulmonary disease, unspecified COPD type (HCC) -     budesonide-glycopyrrolate-formoterol (BREZTRI  AEROSPHERE) 160-9-4.8 MCG/ACT AERO inhaler; Inhale 2 puffs into the lungs 2 (two) times daily.  Paroxysmal atrial fibrillation (HCC)  Coronary artery disease involving native coronary artery of native heart, unspecified whether angina present -     Ambulatory referral to Cardiology -     carvedilol  (COREG ) 25 MG tablet; Take 1 tablet (25 mg total) by mouth daily.  History of ST elevation myocardial infarction (STEMI) -     Ambulatory referral to Cardiology  Other fatigue -     Ambulatory referral to Cardiology -     CMP14+EGFR -     Anemia Profile B -     TSH + free T4  Chronic bilateral low back pain with left-sided sciatica -     gabapentin  (NEURONTIN ) 600 MG tablet; Take 0.5 tablets (300 mg total) by mouth at bedtime.  Gastroesophageal reflux disease without esophagitis  Irritable bowel syndrome with diarrhea -     dicyclomine  (BENTYL ) 10 MG capsule; 1 PO 30 MINUTES PRIOR TO FIRST MEAL  Assessment and Plan    Chronic obstructive pulmonary disease (COPD) in a current smoker COPD with wheezing and decreased breath sounds. Smoking cessation complicated by allergies to nicotine replacement and adverse reactions to medications. - Prescribe Breztri  inhaler: 2 puffs morning and night. - Continue albuterol  inhaler as rescue. - Instruct to contact if Breztri  inhaler not covered or too  expensive.  Coronary artery disease, status post stent placement, Hx of NSTEMI Coronary artery disease with three stents. Reports fatigue and hesitance about invasive procedures due to past experiences. - Refer to cardiologist in [place] for follow-up. - continue aspirin   Paroxsymal A. Fib RRR today.   HTN BP at goal.  HLD Declines treatment.   Chronic back pain with neuropathy Chronic back pain with  neuropathy. Gabapentin  effective for symptoms. - Continue gabapentin  for neuropathy.  Fatigue Referral to cardiology. Will try breztri  for COPD treatment. Mild depression may be contributing. Will check anemia panel, TSH, and CMP.   Gastroesophageal reflux disease (GERD) GERD well-controlled with current medication.  Irritable bowel syndrome (IBS) IBS managed by dietary modifications. Anxiety related to IBS symptoms noted.      Return in about 3 months (around 04/05/2024) for chronic follow up.  The patient indicates understanding of these issues and agrees with the plan.   Annabella CHRISTELLA Search, FNP

## 2024-01-06 LAB — CMP14+EGFR
ALT: 11 IU/L (ref 0–32)
AST: 14 IU/L (ref 0–40)
Albumin: 4.6 g/dL (ref 3.9–4.9)
Alkaline Phosphatase: 94 IU/L (ref 49–135)
BUN/Creatinine Ratio: 11 — ABNORMAL LOW (ref 12–28)
BUN: 9 mg/dL (ref 8–27)
Bilirubin Total: 0.2 mg/dL (ref 0.0–1.2)
CO2: 20 mmol/L (ref 20–29)
Calcium: 9.6 mg/dL (ref 8.7–10.3)
Chloride: 96 mmol/L (ref 96–106)
Creatinine, Ser: 0.8 mg/dL (ref 0.57–1.00)
Globulin, Total: 2.6 g/dL (ref 1.5–4.5)
Glucose: 93 mg/dL (ref 70–99)
Potassium: 4.7 mmol/L (ref 3.5–5.2)
Sodium: 131 mmol/L — ABNORMAL LOW (ref 134–144)
Total Protein: 7.2 g/dL (ref 6.0–8.5)
eGFR: 82 mL/min/1.73 (ref >59)

## 2024-01-06 LAB — ANEMIA PROFILE B
Basophils Absolute: 0.1 x10E3/uL (ref 0.0–0.2)
Basos: 1 %
EOS (ABSOLUTE): 0.4 x10E3/uL (ref 0.0–0.4)
Eos: 4 %
Ferritin: 66 ng/mL (ref 15–150)
Folate: 2.8 ng/mL — ABNORMAL LOW (ref >3.0)
Hematocrit: 45.5 % (ref 34.0–46.6)
Hemoglobin: 15.4 g/dL (ref 11.1–15.9)
Immature Grans (Abs): 0.1 x10E3/uL (ref 0.0–0.1)
Immature Granulocytes: 1 %
Iron Saturation: 19 % (ref 15–55)
Iron: 70 ug/dL (ref 27–139)
Lymphocytes Absolute: 3 x10E3/uL (ref 0.7–3.1)
Lymphs: 34 %
MCH: 32.4 pg (ref 26.6–33.0)
MCHC: 33.8 g/dL (ref 31.5–35.7)
MCV: 96 fL (ref 79–97)
Monocytes Absolute: 0.8 x10E3/uL (ref 0.1–0.9)
Monocytes: 9 %
Neutrophils Absolute: 4.7 x10E3/uL (ref 1.4–7.0)
Neutrophils: 51 %
Platelets: 315 x10E3/uL (ref 150–450)
RBC: 4.75 x10E6/uL (ref 3.77–5.28)
RDW: 13.1 % (ref 11.7–15.4)
Retic Ct Pct: 1.4 % (ref 0.6–2.6)
Total Iron Binding Capacity: 359 ug/dL (ref 250–450)
UIBC: 289 ug/dL (ref 118–369)
Vitamin B-12: 270 pg/mL (ref 232–1245)
WBC: 8.9 x10E3/uL (ref 3.4–10.8)

## 2024-01-06 LAB — TSH+FREE T4
Free T4: 1.1 ng/dL (ref 0.82–1.77)
TSH: 2.47 u[IU]/mL (ref 0.450–4.500)

## 2024-01-07 ENCOUNTER — Ambulatory Visit: Payer: Self-pay | Admitting: Family Medicine

## 2024-01-07 DIAGNOSIS — E538 Deficiency of other specified B group vitamins: Secondary | ICD-10-CM

## 2024-01-07 MED ORDER — FOLIC ACID 1 MG PO TABS: 1.0000 mg | ORAL_TABLET | Freq: Every day | ORAL | 3 refills | Status: DC

## 2024-01-08 MED ORDER — FOLIC ACID 1 MG PO TABS: 1.0000 mg | ORAL_TABLET | Freq: Every day | ORAL | 3 refills | Status: AC

## 2024-01-08 NOTE — Telephone Encounter (Signed)
 Fax from [facility] [place] [facility] Pt does not have authorization for community care, please send Rx to an outside [facility]. Sending Rx for Folic acid  to [facility] which is where most recent scripts have gone to.

## 2024-01-20 ENCOUNTER — Telehealth: Payer: Self-pay

## 2024-01-20 NOTE — Telephone Encounter (Signed)
 Copied from CRM [id number]. Topic: General - Other >> Jan 20, 2024  8:07 AM Myrick T wrote: Reason for CRM: Rosaline from [facility] called to see if Licia was a patient here and request a call back at  [phone] for a statin referral recommendation.

## 2024-02-10 ENCOUNTER — Telehealth: Payer: Self-pay

## 2024-02-10 NOTE — Telephone Encounter (Signed)
 Copied from CRM #[id number]. Topic: Clinical - Medication Question >> Feb 10, 2024  1:59 PM Donna BRAVO wrote: Reason for CRM:  Angela Roth (Pharmacy) [phone] [facility] care pharmacy ext [phone]  Angela Roth spoke with the patient today 02/10/24. Reviewing record asked patient if she had taken a statin, patient was unsure. Angela Roth stated patient took  Rosuvastatin  10mg  tablet 07/2022 this medication was not refilled, patient does not recall taking this medication. Angela Roth said she would call provider to have Angela Roth to review and assess if patient needs to be on a statin.        Angela Roth is expecting a call back regarding this medication.

## 2024-02-11 NOTE — Telephone Encounter (Signed)
Patient declined statin.

## 2024-03-16 ENCOUNTER — Other Ambulatory Visit: Payer: Self-pay | Admitting: Family Medicine

## 2024-03-16 DIAGNOSIS — G8929 Other chronic pain: Secondary | ICD-10-CM

## 2024-05-18 ENCOUNTER — Ambulatory Visit: Admitting: Cardiology

## 2024-08-11 ENCOUNTER — Ambulatory Visit: Admitting: Cardiology
